# Patient Record
Sex: Male | Born: 1985 | Race: White | Hispanic: No | Marital: Single | Smoking: Former smoker
Health system: Southern US, Community
[De-identification: ages and names within clinical notes are randomized; demographics above are authoritative.]

## PROBLEM LIST (undated history)

## (undated) DIAGNOSIS — F17200 Nicotine dependence, unspecified, uncomplicated: Secondary | ICD-10-CM

## (undated) HISTORY — DX: Nicotine dependence, unspecified, uncomplicated: F17.200

---

## 2007-07-22 ENCOUNTER — Ambulatory Visit: Payer: Self-pay | Admitting: Family Medicine

## 2010-03-05 ENCOUNTER — Ambulatory Visit: Payer: Self-pay | Admitting: Family Medicine

## 2010-04-30 ENCOUNTER — Ambulatory Visit: Payer: Self-pay | Admitting: Family Medicine

## 2010-08-15 ENCOUNTER — Ambulatory Visit: Payer: Self-pay | Admitting: Physician Assistant

## 2010-08-15 ENCOUNTER — Ambulatory Visit (INDEPENDENT_AMBULATORY_CARE_PROVIDER_SITE_OTHER): Payer: 59 | Admitting: Family Medicine

## 2010-08-15 DIAGNOSIS — R42 Dizziness and giddiness: Secondary | ICD-10-CM

## 2011-06-16 ENCOUNTER — Ambulatory Visit (INDEPENDENT_AMBULATORY_CARE_PROVIDER_SITE_OTHER): Payer: Self-pay | Admitting: Medical

## 2011-06-16 ENCOUNTER — Encounter: Payer: Self-pay | Admitting: Internal Medicine

## 2011-06-16 ENCOUNTER — Encounter: Payer: Self-pay | Admitting: Medical

## 2011-06-16 VITALS — BP 110/80 | HR 68 | Temp 97.8°F | Resp 16 | Wt 172.0 lb

## 2011-06-16 DIAGNOSIS — R071 Chest pain on breathing: Secondary | ICD-10-CM

## 2011-06-16 DIAGNOSIS — R0789 Other chest pain: Secondary | ICD-10-CM

## 2011-06-16 DIAGNOSIS — S39012A Strain of muscle, fascia and tendon of lower back, initial encounter: Secondary | ICD-10-CM | POA: Insufficient documentation

## 2011-06-16 DIAGNOSIS — IMO0002 Reserved for concepts with insufficient information to code with codable children: Secondary | ICD-10-CM

## 2011-06-16 MED ORDER — CYCLOBENZAPRINE HCL 10 MG PO TABS: ORAL_TABLET | ORAL | Status: DC

## 2011-06-16 MED ORDER — HYDROCODONE-ACETAMINOPHEN 5-500 MG PO TABS: 1.0000 | ORAL_TABLET | Freq: Four times a day (QID) | ORAL | Status: AC | PRN

## 2011-06-16 NOTE — Patient Instructions (Signed)
Rest, no weight lifting until this pain resolves.  Remember to take deep breaths on the hour.  You can use an ice pack as desired the next few days.  Then, after 3 days, you can use heat if desired.    Use 3-4 OTC Ibuprofen every 6 hours for pain and inflammation.  I wrote 2 prescriptions today.  1 is a muscle relaxer you can use at bedtime or up to 3 times daily if needed.   I also wrote for Lortab which is a pain medication.  Use this sparingly only for breakthrough.    Low Back Strain with Rehab A strain is an injury in which a tendon or muscle is torn. The muscles and tendons of the lower back are vulnerable to strains. However, these muscles and tendons are very strong and require a great force to be injured. Strains are classified into three categories. Grade 1 strains cause pain, but the tendon is not lengthened. Grade 2 strains include a lengthened ligament, due to the ligament being stretched or partially ruptured. With grade 2 strains there is still function, although the function may be decreased. Grade 3 strains involve a complete tear of the tendon or muscle, and function is usually impaired. SYMPTOMS   Pain in the lower back.   Pain that affects one side more than the other.   Pain that gets worse with movement and may be felt in the hip, buttocks, or back of the thigh.   Muscle spasms of the muscles in the back.   Swelling along the muscles of the back.   Loss of strength of the back muscles.   Crackling sound (crepitation) when the muscles are touched.  CAUSES  Lower back strains occur when a force is placed on the muscles or tendons that is greater than they can handle. Common causes of injury include:  Prolonged overuse of the muscle-tendon units in the lower back, usually from incorrect posture.   A single violent injury or force applied to the back.  RISK INCREASES WITH:  Sports that involve twisting forces on the spine or a lot of bending at the waist (football,  rugby, weightlifting, bowling, golf, tennis, speed skating, racquetball, swimming, running, gymnastics, diving).   Poor strength and flexibility.   Failure to warm up properly before activity.   Family history of lower back pain or disk disorders.   Previous back injury or surgery (especially fusion).   Poor posture with lifting, especially heavy objects.   Prolonged sitting, especially with poor posture.  PREVENTION   Learn and use proper posture when sitting or lifting (maintain proper posture when sitting, lift using the knees and legs, not at the waist).   Warm up and stretch properly before activity.   Allow for adequate recovery between workouts.   Maintain physical fitness:   Strength, flexibility, and endurance.   Cardiovascular fitness.  PROGNOSIS  If treated properly, lower back strains usually heal within 6 weeks. RELATED COMPLICATIONS   Recurring symptoms, resulting in a chronic problem.   Chronic inflammation, scarring, and partial muscle-tendon tear.   Delayed healing or resolution of symptoms.   Prolonged disability.  TREATMENT  Treatment first involves the use of ice and medicine, to reduce pain and inflammation. The use of strengthening and stretching exercises may help reduce pain with activity. These exercises may be performed at home or with a therapist. Severe injuries may require referral to a therapist for further evaluation and treatment, such as ultrasound. Your caregiver may advise  that you wear a back brace or corset, to help reduce pain and discomfort. Often, prolonged bed rest results in greater harm then benefit. Corticosteroid injections may be recommended. However, these should be reserved for the most serious cases. It is important to avoid using your back when lifting objects. At night, sleep on your back on a firm mattress with a pillow placed under your knees. If non-surgical treatment is unsuccessful, surgery may be needed.  MEDICATION    If pain medicine is needed, nonsteroidal anti-inflammatory medicines (aspirin and ibuprofen), or other minor pain relievers (acetaminophen), are often advised.   Do not take pain medicine for 7 days before surgery.   Prescription pain relievers may be given, if your caregiver thinks they are needed. Use only as directed and only as much as you need.   Ointments applied to the skin may be helpful.   Corticosteroid injections may be given by your caregiver. These injections should be reserved for the most serious cases, because they may only be given a certain number of times.  HEAT AND COLD  Cold treatment (icing) should be applied for 10 to 15 minutes every 2 to 3 hours for inflammation and pain, and immediately after activity that aggravates your symptoms. Use ice packs or an ice massage.   Heat treatment may be used before performing stretching and strengthening activities prescribed by your caregiver, physical therapist, or athletic trainer. Use a heat pack or a warm water soak.  SEEK MEDICAL CARE IF:   Symptoms get worse or do not improve in 2 to 4 weeks, despite treatment.   You develop numbness, weakness, or loss of bowel or bladder function.  New, unexplained symptoms develop. (Drugs used in treatment may produce side effects.)

## 2011-06-16 NOTE — Progress Notes (Signed)
Subjective:   HPI  Jeremiah Bentley is a 25 y.o. male who presents with c/o injury and back pain.  He lifts weights regularly at the [facility].  Yesterday he was lifting 70 lb dumbbells to take to the bench to do flyes.  He went to move and became off balance lifting the weights slightly and overcompensated on the right.  Since then has had right mid back pain and side pain.  Hurts to take deep breaths as well.  He used 3 Ibuprofen last night with some relief.  Denies any recent fever, URI symptoms, no other injury or trauma.  No other aggravating or relieving factors.    No other c/o.  The following portions of the patient's history were reviewed and updated as appropriate: allergies, current medications, past family history, past medical history, past social history, past surgical history and problem list.  Past Medical History  Diagnosis Date  . Smoker    Review of Systems Constitutional: -fever, -chills, -sweats, -unexpected -weight change,-fatigue ENT: -runny nose, -ear pain, -sore throat Cardiology:  -chest pain, -palpitations, -edema Respiratory: -cough, -shortness of breath, -wheezing Gastroenterology: -abdominal pain, -nausea, -vomiting, -diarrhea, -constipation Hematology: -bleeding or bruising problems Musculoskeletal: -arthralgias, -myalgias, -joint swelling, +back pain Ophthalmology: -vision changes Urology: -dysuria, -difficulty urinating, -hematuria, -urinary frequency, -urgency Neurology: -headache, -weakness, -tingling, -numbness    Objective:   Physical Exam  Filed Vitals:   06/16/11 1013  BP: 110/80  Pulse: 68  Temp: 97.8 F (36.6 C)  Resp: 16    General appearance: alert, no distress, WD/WN, lean white male Neck: supple, no lymphadenopathy, no thyromegaly, no masses Heart: RRR, normal S1, S2, no murmurs Lungs: CTA bilaterally, no wheezes, rhonchi, or rales Abdomen: +bs, soft, non tender, non distended, no masses, no hepatomegaly, no splenomegaly Back: mild  right posterolateral back/chest wall around T8-T10 Chest wall: tender right middle posterolateral region generalized mildly Pulses: 2+ symmetric   Assessment and Plan :    Encounter Diagnoses  Name Primary?  . Back strain Yes  . Chest wall pain    Advise rest, no weight lifting until this resolves, ice pack if desired, and after 3 days can use a combination of ice and heat.  Use 3-4 ibuprofen over-the-counter every 6 hours for the next few days, Flexeril once to twice a day as needed, Lortab for breakthrough pain if needed, and as symptoms gradually resolve, can gradually resume normal activity.  Call return if worse or not improving in 5-7 days.  Follow-up otherwise when necessary

## 2012-09-13 ENCOUNTER — Encounter: Payer: Self-pay | Admitting: Family Medicine

## 2012-09-13 ENCOUNTER — Ambulatory Visit (INDEPENDENT_AMBULATORY_CARE_PROVIDER_SITE_OTHER): Payer: 59 | Admitting: Family Medicine

## 2012-09-13 VITALS — BP 130/90 | HR 60 | Wt 167.0 lb

## 2012-09-13 DIAGNOSIS — J019 Acute sinusitis, unspecified: Secondary | ICD-10-CM

## 2012-09-13 DIAGNOSIS — J209 Acute bronchitis, unspecified: Secondary | ICD-10-CM

## 2012-09-13 MED ORDER — AMOXICILLIN 875 MG PO TABS: 875.0000 mg | ORAL_TABLET | Freq: Two times a day (BID) | ORAL | Status: DC

## 2012-09-13 NOTE — Progress Notes (Signed)
  Subjective:    Patient ID: Jeremiah Bentley, male    DOB: 1985-08-08, 27 y.o.   MRN: [id number]  HPI He woke up 3 days ago with sinus headache, nasal congestion, rhinorrhea and productive cough with PND. No fever or chills but increased fatigue. Today he noted some blood in his nasal drainage. He continues had difficulty with coughing that causes chest pain. He quit smoking in October but has been using an e-cigarette.   Review of Systems     Objective:   Physical Exam alert and in no distress. Tympanic membranes and canals are normal. Throat is clear. Tonsils are normal. Neck is supple without adenopathy or thyromegaly. Cardiac exam shows a regular sinus rhythm without murmurs or gallops. Lungs are clear to auscultation. Nasal mucosa is red but no evidence of recent bleeding. Sinuses are nontender       Assessment & Plan:  Acute sinusitis - Plan: amoxicillin (AMOXIL) 875 MG tablet  Acute bronchitis - Plan: amoxicillin (AMOXIL) 875 MG tablet Use Robitussin-DM during the day and you can use NyQuil at night. Take all the antibiotic and if not totally back to normal when you finish them, call me.

## 2012-09-13 NOTE — Patient Instructions (Addendum)
Use Robitussin-DM during the day and you can use NyQuil at night. Take all the antibiotic and if not totally back to normal when you finish them, call me.

## 2012-11-17 ENCOUNTER — Encounter: Payer: Self-pay | Admitting: Medical

## 2012-11-17 ENCOUNTER — Ambulatory Visit (INDEPENDENT_AMBULATORY_CARE_PROVIDER_SITE_OTHER): Payer: PRIVATE HEALTH INSURANCE | Admitting: Medical

## 2012-11-17 VITALS — BP 118/80 | HR 88 | Temp 98.0°F | Resp 16 | Wt 173.0 lb

## 2012-11-17 DIAGNOSIS — M25519 Pain in unspecified shoulder: Secondary | ICD-10-CM

## 2012-11-17 DIAGNOSIS — S239XXA Sprain of unspecified parts of thorax, initial encounter: Secondary | ICD-10-CM

## 2012-11-17 DIAGNOSIS — S29012A Strain of muscle and tendon of back wall of thorax, initial encounter: Secondary | ICD-10-CM

## 2012-11-17 DIAGNOSIS — M25511 Pain in right shoulder: Secondary | ICD-10-CM

## 2012-11-17 MED ORDER — NAPROXEN SODIUM ER 500 MG PO TB24: 500.0000 mg | ORAL_TABLET | Freq: Every day | ORAL | Status: DC

## 2012-11-17 NOTE — Patient Instructions (Addendum)
Left shoulder pain seems to be muscle strain of the latissimus muscle  Recommendations:  For the next 3-5 days, use relative rest  Consider heat pad to the back  Begin samples of Naprelan 500mg , 1 tablet daily for the next 5-6 days  Over the next 3-5 days, no heavy lifting, no strenuous activity with the left arm   Right shoulder pain, hx/o cervical plexus injury  For pain flares ups, treat this similar to what we discussed with the left shoulder  Rest for a few days at time   OTC aleve up to twice daily as needed   Heat pad or ice for flare ups  Avoid heavy lifting with flare ups  Call back with name of the cream your mother gave you

## 2012-11-17 NOTE — Progress Notes (Signed)
Subjective:  Jeremiah Bentley is a 27 y.o. male who presents with left upper back pain, can shoot to the front of the chest.  He is right handed.  He notes shoulder pains in left posterior area.  Started this past Friday as a dull pain, but as weekend progressed, had sharp pain coming to the front of chest.  Used mom's prescription cream topically and this helped.  Works as Control and instrumentation engineer.  He does lift auto parts, lifted a car door last week, but no specific injury or trauma.  No redness, no bruising, no fever, no chest pain, no SOB or dyspnea, no URI symptoms.  No NV, no hemoptysis.  Has had similar on right side pain from football stinger injuries in the past.   Does get these right sided pains periodically.   Occasionally has right neck pain.  No numbness, tingling, weakness.  Riding in car for long periods makes right shoulder girdle sore, but lifting things the wrong way can aggravate the right arm.  Bending down, working on cars can aggravate the left shoulder pain.  Using nothing else including ice for symptoms.   No prior back or neck injuries.  No other aggravating or relieving factors.    No other c/o.  The following portions of the patient's history were reviewed and updated as appropriate: allergies, current medications, past family history, past medical history, past social history, past surgical history and problem list.  Past Medical History  Diagnosis Date  . Smoker    History reviewed. No pertinent past surgical history.  ROS Otherwise as in subjective above  Objective: Physical Exam  Vital signs reviewed  General appearance: alert, no distress, WD/WN, muscular white male Neck: supple, no lymphadenopathy, no thyromegaly, no masses, normal ROM, nontender MSK: no obvious step off, asymmetry or deformity. Left latissimus and posterior shoulder tender, otherwise left shoulder nontender, no deformity, no step off, normal ROM, otherwise unremarkable exam.   Right shoulder nontender, no deformity, normal exam.   Back: nontender UE neurovascularly intact  Assessment: Encounter Diagnoses  Name Primary?  . Strain of latissimus dorsi muscle, initial encounter Yes  . Shoulder pain, right     Plan: Left latissimus strain.  Right shoulder pain, etiology unclear, but no major findings today.  If problem worsens in the right, consider neck and shoulder xrays.  Patient Instructions  Left shoulder pain seems to be muscle strain of the latissimus muscle  Recommendations:  For the next 3-5 days, use relative rest  Consider heat pad to the back  Begin samples of Naprelan 500mg , 1 tablet daily for the next 5-6 days  Over the next 3-5 days, no heavy lifting, no strenuous activity with the left arm   Right shoulder pain, hx/o cervical plexus injury  For pain flares ups, treat this similar to what we discussed with the left shoulder  Rest for a few days at time   OTC aleve up to twice daily as needed   Heat pad or ice for flare ups  Avoid heavy lifting with flare ups  Call back with name of the cream your mother gave you

## 2013-12-06 ENCOUNTER — Ambulatory Visit (INDEPENDENT_AMBULATORY_CARE_PROVIDER_SITE_OTHER): Payer: PRIVATE HEALTH INSURANCE | Admitting: Medical

## 2013-12-06 ENCOUNTER — Encounter: Payer: Self-pay | Admitting: Medical

## 2013-12-06 VITALS — BP 102/78 | HR 68 | Temp 100.7°F | Resp 16 | Wt 177.0 lb

## 2013-12-06 DIAGNOSIS — J020 Streptococcal pharyngitis: Secondary | ICD-10-CM

## 2013-12-06 DIAGNOSIS — J029 Acute pharyngitis, unspecified: Secondary | ICD-10-CM

## 2013-12-06 LAB — POCT RAPID STREP A (OFFICE): Rapid Strep A Screen: POSITIVE — AB

## 2013-12-06 MED ORDER — PENICILLIN V POTASSIUM 500 MG PO TABS: ORAL_TABLET | ORAL | Status: DC

## 2013-12-06 NOTE — Progress Notes (Signed)
Subjective:  Jeremiah Bentley is a 28 y.o. male who presents for sore throat.  He reports a one-day history of sore throat, aggressively worsen, started on the left, now hurts bilat, has felt some dizzy, chills, has had hacking cough, difficulty swallowing.  Denies fever, headache, NVD, congestion, ear pain.  Using nothing for symptoms.  Denies sick contacts.  No other aggravating or relieving factors.  No other c/o.  ROS as in subjective   Objective: Filed Vitals:   12/06/13 1429  BP: 102/78  Pulse: 68  Temp: 100.7 F (38.2 C)  Resp: 16    General appearance: Alert, WD/WN, no distress                             Skin: warm, no rash                           Head: no sinus tenderness,                            Eyes: conjunctiva normal, corneas clear, PERRLA                            Ears: pearly TMs, external ear canals normal                          Nose: septum midline, turbinates normal, no discharge             Mouth/throat: MMM, tongue normal, mild pharyngeal erythema, + left white mild tonsillar exudate                           Neck: supple, shoddy anterior nodes, no thyromegaly, nontender                          Heart: RRR, normal S1, S2, no murmurs                         Lungs: CTA bilaterally, no wheezes, rales, or rhonchi      Assessment and Plan:  Encounter Diagnoses  Name Primary?  . Strep pharyngitis Yes  . Sore throat    Advised that sore throat etiology appears to be bacterial.  Antibiotics per orders below.  Discussed symptoms, diagnosis, and possible complications including peritonsillar abscess formation.  Advised that they will be infectious for 24 hours after starting antibiotics.  Discussed means of prevention, precautions.  Supportive care recommended including OTC analgesics, salt water gargles, warm fluids, good hydration, and rest.  Discussed signs or symptoms that would prompt immediate evaluation.   Call or return if worse or not improving in the next  2-3 days.

## 2013-12-06 NOTE — Patient Instructions (Signed)
Strep Throat Strep throat is an infection of the throat. It is caused by a germ. Strep throat spreads from person to person by coughing, sneezing, or close contact. HOME CARE  Rinse your mouth (gargle) with warm salt water (1 teaspoon salt in 1 cup of water). Do this 3 to 4 times per day or as needed for comfort.   Family members with a sore throat or fever should see a doctor.   Make sure everyone in your house washes their hands well.   Do not share food, drinking cups, or personal items.   Eat soft foods until your sore throat gets better.   Drink enough water and fluids to keep your pee (urine) clear or pale yellow.   Rest.   Stay home from school, daycare, or work until you have taken medicine for 24 hours.   Only take medicine as told by your doctor.   Take your medicine as told. Finish it even if you start to feel better.  GET HELP RIGHT AWAY IF:   You have new problems, such as throwing up (vomiting) or bad headaches.   You have a stiff or painful neck, chest pain, trouble breathing, or trouble swallowing.   You have very bad throat pain, drooling, or changes in your voice.   Your neck puffs up (swells) or gets red and tender.   You have a fever.   You are very tired, your mouth is dry, or you are peeing less than normal.   You cannot wake up completely.   You get a rash, cough, or earache.   You have green, yellow-brown, or bloody spit.   Your pain does not get better with medicine.  MAKE SURE YOU:   Understand these instructions.   Will watch your condition.   Will get help right away if you are not doing well or get worse.  Document Released: 12/10/2007 Document Revised: 03/05/2011 Document Reviewed: 08/22/2010 [employer] Patient Information 2012 [employer]. 

## 2014-01-09 ENCOUNTER — Ambulatory Visit (INDEPENDENT_AMBULATORY_CARE_PROVIDER_SITE_OTHER): Payer: PRIVATE HEALTH INSURANCE | Admitting: Family Medicine

## 2014-01-09 ENCOUNTER — Encounter: Payer: Self-pay | Admitting: Family Medicine

## 2014-01-09 VITALS — BP 130/84 | HR 80 | Ht 68.0 in | Wt 183.0 lb

## 2014-01-09 DIAGNOSIS — M546 Pain in thoracic spine: Secondary | ICD-10-CM

## 2014-01-09 MED ORDER — HYDROCODONE-ACETAMINOPHEN 5-325 MG PO TABS: 1.0000 | ORAL_TABLET | Freq: Four times a day (QID) | ORAL | Status: DC | PRN

## 2014-01-09 MED ORDER — NAPROXEN 500 MG PO TABS: 500.0000 mg | ORAL_TABLET | Freq: Two times a day (BID) | ORAL | Status: DC

## 2014-01-09 NOTE — Progress Notes (Signed)
Chief Complaint  Patient presents with  . Back Pain    pain in his mid back that radiates around to the front (rib area). Says he may have pulled something yesterday when he went to "pop his back."   After getting off his lawnmower his back was bothering him.  He went to "pop" his back, and then developed severe pain.  Hurts to breath, pain radiates around both sides of his back.   Denies radiation of pain down the legs, or around to abdomen, remains in the back, radiating laterally only.   Something similar occurred in the past, in 2012, treated with muscle relaxant, pain pills and resolved.  Took 2 extra strength tylenol last night, and 2 this morning.  No other medications.  Past Medical History  Diagnosis Date  . Smoker    History reviewed. No pertinent past surgical history. History   Social History  . Marital Status: Single    Spouse Name: N/A    Number of Children: N/A  . Years of Education: N/A   Occupational History  . Not on file.   Social History Main Topics  . Smoking status: Current Every Day Smoker    Types: Cigarettes  . Smokeless tobacco: Not on file     Comment: smoked 1PPD, quit, restarted; now smoking 1 pack/week.  using e-cigs  . Alcohol Use: Yes     Comment: 8 drinks per week.  . Drug Use: No  . Sexual Activity: Not on file   Other Topics Concern  . Not on file   Social History Narrative  . No narrative on file   No current outpatient prescriptions on file prior to visit.   No current facility-administered medications on file prior to visit.   No Known Allergies  ROS:  Denies fevers, chills, URI symptoms, urinary complaints, numbness, tingling, weakness or other complaints.  No nausea, vomiting, diarrhea, bleeding/bruising, rash  PHYSICAL EXAM: BP 130/84  Pulse 80  Ht 5\' 8"  (1.727 m)  Wt 183 lb (83.008 kg)  BMI 27.83 kg/m2  Pleasant male, in moderate distress with movement.  Breathing comfortably, speaking easily Back Spine is nontender  to palpation.  No CVA tenderness. No muscle spasm palpable. Area of discomfort is upper lumbar, lower thoracic paraspinous area.   Lungs clear bilaterally Heart: regular rate and rhythm Skin: no rashes, bruising  ASSESSMENT/PLAN:  Midline thoracic back pain - Plan: naproxen (NAPROSYN) 500 MG tablet, HYDROcodone-acetaminophen (NORCO/VICODIN) 5-325 MG per tablet  Ice alternating with heat.  Stretches Declined toradol injection. Risks/side effects of prescribed meds were reviewed in detail.  Use naproxen twice daily with food. Take the hydrocodone only if needed for severe pain.  This also contains acetaminophen, so do NOT take the hydrocodone along with the pain medication that you already have. Do not use any other over-the-counter pain medications.  Consider seeing chiropractor if pain persists despite these measures.

## 2014-01-09 NOTE — Patient Instructions (Signed)
Use naproxen twice daily with food. Take the hydrocodone only if needed for severe pain.  This also contains acetaminophen, so do NOT take the hydrocodone along with the pain medication that you already have. Do not use any other over-the-counter pain medications.  Consider seeing chiropractor if pain persists despite these measures.

## 2015-02-12 ENCOUNTER — Ambulatory Visit (INDEPENDENT_AMBULATORY_CARE_PROVIDER_SITE_OTHER): Payer: Managed Care, Other (non HMO) | Admitting: Family Medicine

## 2015-02-12 ENCOUNTER — Encounter: Payer: Self-pay | Admitting: Family Medicine

## 2015-02-12 VITALS — BP 138/88 | HR 80 | Temp 98.1°F | Wt 181.8 lb

## 2015-02-12 DIAGNOSIS — R05 Cough: Secondary | ICD-10-CM | POA: Diagnosis not present

## 2015-02-12 DIAGNOSIS — R059 Cough, unspecified: Secondary | ICD-10-CM

## 2015-02-12 DIAGNOSIS — J029 Acute pharyngitis, unspecified: Secondary | ICD-10-CM | POA: Diagnosis not present

## 2015-02-12 LAB — POCT RAPID STREP A (OFFICE): Rapid Strep A Screen: NEGATIVE

## 2015-02-12 NOTE — Progress Notes (Addendum)
   Subjective:    Patient ID: Jeremiah Bentley, male    DOB: 1985-10-29, 29 y.o.   MRN: [id number]  HPI Patient presents for a 7 day history of dry cough with occasional clear mucous production. Cough is more bothersome during the day and is not keeping him awake at night. He also complains of a 1 day history of sore throat with some mild post nasal drainage. He states he would like to make sure he does not have "strep". He denies fever, chills, sinus pressure, congestion, ear pain. He states his cough and sore throat have actually improved since yesterday. He is taking Nyquil and cough drops for relief. No sick contacts. He smokes cigarettes.  He states he thinks he may have allergies to dust and works in an [employer]. He is not currently taking medication for this.    Review of Systems Review of Systems Constitutional: -fever, -chills, -sweats, -unexpected weight change,-fatigue ENT: -runny nose, -ear pain, +sore throat Cardiology:  -chest pain, -palpitations, -edema Respiratory: +cough, -shortness of breath, -wheezing        Objective:   Physical Exam Alert and in no distress.  Tympanic membranes and canals are normal. Sinuses without tenderness, nares pink. Pharyngeal area is normal. Neck is supple without adenopathy. Cardiac exam shows a regular sinus rhythm without murmurs or gallops. Lungs are clear to auscultation.  Rapid strep test negative      Assessment & Plan:  Cough  Sore throat - Plan: Rapid Strep A  Recommended symptom management since he is symptoms seem to be improving. Encouraged hydration. Suspect that he his symptoms are viral or that environmental allergies could be playing a role with his current illness. Advised that if he is not improving in the next few days or if his symptoms get much worse that he should let me know. May try taking Claritin to see if this helps.

## 2015-05-02 ENCOUNTER — Telehealth: Payer: Self-pay | Admitting: Family Medicine

## 2015-05-02 ENCOUNTER — Encounter: Payer: Self-pay | Admitting: Family Medicine

## 2015-05-02 ENCOUNTER — Ambulatory Visit (INDEPENDENT_AMBULATORY_CARE_PROVIDER_SITE_OTHER): Payer: Managed Care, Other (non HMO) | Admitting: Family Medicine

## 2015-05-02 VITALS — BP 128/84 | HR 72 | Ht 68.0 in | Wt 182.2 lb

## 2015-05-02 DIAGNOSIS — Z0000 Encounter for general adult medical examination without abnormal findings: Secondary | ICD-10-CM

## 2015-05-02 DIAGNOSIS — F17200 Nicotine dependence, unspecified, uncomplicated: Secondary | ICD-10-CM

## 2015-05-02 LAB — CBC WITH DIFFERENTIAL/PLATELET
BASOS ABS: 0.1 10*3/uL (ref 0.0–0.1)
Basophils Relative: 1 % (ref 0–1)
EOS ABS: 0.4 10*3/uL (ref 0.0–0.7)
EOS PCT: 3 % (ref 0–5)
HEMATOCRIT: 48.4 % (ref 39.0–52.0)
Hemoglobin: 16.8 g/dL (ref 13.0–17.0)
LYMPHS ABS: 3.8 10*3/uL (ref 0.7–4.0)
LYMPHS PCT: 32 % (ref 12–46)
MCH: 30.4 pg (ref 26.0–34.0)
MCHC: 34.7 g/dL (ref 30.0–36.0)
MCV: 87.5 fL (ref 78.0–100.0)
MONO ABS: 0.6 10*3/uL (ref 0.1–1.0)
MONOS PCT: 5 % (ref 3–12)
MPV: 9.7 fL (ref 8.6–12.4)
Neutro Abs: 7.1 10*3/uL (ref 1.7–7.7)
Neutrophils Relative %: 59 % (ref 43–77)
PLATELETS: 308 10*3/uL (ref 150–400)
RBC: 5.53 MIL/uL (ref 4.22–5.81)
RDW: 11.8 % (ref 11.5–15.5)
WBC: 12 10*3/uL — ABNORMAL HIGH (ref 4.0–10.5)

## 2015-05-02 LAB — LIPID PANEL
Cholesterol: 200 mg/dL (ref 125–200)
HDL: 31 mg/dL — ABNORMAL LOW (ref >=40)
LDL CALC: 145 mg/dL — AB (ref <130)
TRIGLYCERIDES: 118 mg/dL (ref <150)
Total CHOL/HDL Ratio: 6.5 Ratio — ABNORMAL HIGH (ref <=5.0)
VLDL: 24 mg/dL (ref <30)

## 2015-05-02 LAB — COMPREHENSIVE METABOLIC PANEL
ALBUMIN: 4.7 g/dL (ref 3.6–5.1)
ALT: 18 U/L (ref 9–46)
AST: 19 U/L (ref 10–40)
Alkaline Phosphatase: 50 U/L (ref 40–115)
BILIRUBIN TOTAL: 0.6 mg/dL (ref 0.2–1.2)
BUN: 9 mg/dL (ref 7–25)
CO2: 26 mmol/L (ref 20–31)
CREATININE: 0.9 mg/dL (ref 0.60–1.35)
Calcium: 9.8 mg/dL (ref 8.6–10.3)
Chloride: 104 mmol/L (ref 98–110)
Glucose, Bld: 93 mg/dL (ref 65–99)
Potassium: 3.9 mmol/L (ref 3.5–5.3)
Sodium: 139 mmol/L (ref 135–146)
Total Protein: 7 g/dL (ref 6.1–8.1)

## 2015-05-02 MED ORDER — VARENICLINE TARTRATE 0.5 MG X 11 & 1 MG X 42 PO MISC: ORAL | Status: DC

## 2015-05-02 NOTE — Telephone Encounter (Signed)
Called in [place], left message for pt

## 2015-05-02 NOTE — Patient Instructions (Signed)
Call [phone] quit now. Look at when, where and why you smoke and then make a list of things that you will do instead of smoking

## 2015-05-02 NOTE — Progress Notes (Signed)
Subjective:    Patient ID: Jeremiah Bentley, male    DOB: 02/22/1986, 29 y.o.   MRN: [id number]  HPI He is here for complete examination. He is interested in quitting smoking. He smokes one pack per day and has done this for several years. In the past he has tried cold [place] as well as vapor but has found them to not be for useful. He is noting more difficulty with shortness of breath and coughing. He is now dating someone and she is also interested in him quitting. They're apparently trying to have children. He has no other concerns or complaints. He's had no nasal congestion, allergies, abdominal symptoms. Family and social history as well as health maintenance and immunizations were reviewed.   Review of Systems  All other systems reviewed and are negative.      Objective:   Physical Exam BP 128/84 mmHg  Pulse 72  Ht 5\' 8"  (1.727 m)  Wt 182 lb 3.2 oz (82.645 kg)  BMI 27.71 kg/m2  General Appearance:    Alert, cooperative, no distress, appears stated age  Head:    Normocephalic, without obvious abnormality, atraumatic  Eyes:    PERRL, conjunctiva/corneas clear, EOM's intact, fundi    benign  Ears:    Normal TM's and external ear canals  Nose:   Nares normal, mucosa normal, no drainage or sinus   tenderness  Throat:   Lips, mucosa, and tongue normal; teeth and gums normal  Neck:   Supple, no lymphadenopathy;  thyroid:  no   enlargement/tenderness/nodules; no carotid   bruit or JVD  Back:    Spine nontender, no curvature, ROM normal, no CVA     tenderness  Lungs:     Clear to auscultation bilaterally without wheezes, rales or     ronchi; respirations unlabored  Chest Wall:    No tenderness or deformity   Heart:    Regular rate and rhythm, S1 and S2 normal, no murmur, rub   or gallop  Breast Exam:    No chest wall tenderness, masses or gynecomastia  Abdomen:     Soft, non-tender, nondistended, normoactive bowel sounds,    no masses, no hepatosplenomegaly  Genitalia:    Normal  male external genitalia without lesions.  Testicles without masses.  No inguinal hernias mole present at the base of the penis.  Rectal:   Deferred due to age <40 and lack of symptoms  Extremities:   No clubbing, cyanosis or edema  Pulses:   2+ and symmetric all extremities  Skin:   Skin color, texture, turgor normal, no rashes or lesions  Lymph nodes:   Cervical, supraclavicular, and axillary nodes normal  Neurologic:   CNII-XII intact, normal strength, sensation and gait; reflexes 2+ and symmetric throughout          Psych:   Normal mood, affect, hygiene and grooming.          Assessment & Plan:  Smoker - Plan: varenicline (CHANTIX STARTING MONTH PAK) 0.5 MG X 11 & 1 MG X 42 tablet  Routine general medical examination at a health care facility - Plan: CBC with Differential/Platelet, Comprehensive metabolic panel, Lipid panel  the mole at the base of the penis is not a concern and I expressed this to him. I then discussed smoking cessation in regard to habit versus addiction. Discussed the need for him to look at his smoking habits in regard to when where and why and then set up a game plan on what  he will do instead of smoking for least timeframes. Chantix prescription written. He is to set up an appointment in one month to see me. I left it up to him as to whether his sepsis appointment up explaining that that will tell me when he is truly ready to quit smoking. Smoking information was given to him. At least 10 minutes was spent discussing this with him.

## 2015-09-04 ENCOUNTER — Ambulatory Visit (INDEPENDENT_AMBULATORY_CARE_PROVIDER_SITE_OTHER): Payer: Managed Care, Other (non HMO) | Admitting: Family Medicine

## 2015-09-04 ENCOUNTER — Encounter: Payer: Self-pay | Admitting: Family Medicine

## 2015-09-04 VITALS — BP 122/78 | HR 102 | Temp 98.1°F | Wt 171.5 lb

## 2015-09-04 DIAGNOSIS — J0110 Acute frontal sinusitis, unspecified: Secondary | ICD-10-CM

## 2015-09-04 DIAGNOSIS — Z720 Tobacco use: Secondary | ICD-10-CM | POA: Diagnosis not present

## 2015-09-04 DIAGNOSIS — F17200 Nicotine dependence, unspecified, uncomplicated: Secondary | ICD-10-CM

## 2015-09-04 MED ORDER — AMOXICILLIN 875 MG PO TABS: 875.0000 mg | ORAL_TABLET | Freq: Two times a day (BID) | ORAL | Status: DC

## 2015-09-04 NOTE — Progress Notes (Signed)
   Subjective:    Patient ID: Jeremiah Bentley, male    DOB: 13-Jun-1986, 30 y.o.   MRN: [id number]  HPI He complains of a one-week istory is started with nasal congestion, rhinorrhea, slight cough and hoarse voice but no sore throat, earache. Yesterday he noted increased difficulty with weakness as well as rhinorrhea, sinus pressure and purulent postnasal drainage. He does smoke. He did try Chantix but had unacceptable side effects from it. He also has underlying springtime allergies but rarely uses medication for this.   Review of Systems     Objective:   Physical Exam Alert and in no distress. Tympanic membranes and canals are normal. Nasal mucosa is normal however he is tender over frontal and maxillary sinusesPharyngeal area is normal. Neck is supple without adenopathy or thyromegaly. Cardiac exam shows a regular sinus rhythm without murmurs or gallops. Lungs are clear to auscultation.        Assessment & Plan:  Acute frontal sinusitis, recurrence not specified - Plan: amoxicillin (AMOXIL) 875 MG tablet  Current smoker Christian to call me if not entirely better when he finishes the antibiotic also discussed smoking cessation and at this time he is not ready to quit.

## 2015-09-04 NOTE — Patient Instructions (Signed)
Take all the antibiotic and if not totally back to normal when he finished E no

## 2016-03-03 ENCOUNTER — Telehealth: Payer: Self-pay | Admitting: Family Medicine

## 2016-03-04 ENCOUNTER — Encounter: Payer: Self-pay | Admitting: Family Medicine

## 2016-03-04 ENCOUNTER — Ambulatory Visit (INDEPENDENT_AMBULATORY_CARE_PROVIDER_SITE_OTHER): Payer: Managed Care, Other (non HMO) | Admitting: Family Medicine

## 2016-03-04 VITALS — BP 120/80 | HR 65 | Wt 186.8 lb

## 2016-03-04 DIAGNOSIS — M7551 Bursitis of right shoulder: Secondary | ICD-10-CM

## 2016-03-04 DIAGNOSIS — M25511 Pain in right shoulder: Secondary | ICD-10-CM

## 2016-03-04 MED ORDER — LIDOCAINE HCL 2 % IJ SOLN
3.0000 mL | Freq: Once | INTRAMUSCULAR | Status: AC
  Administered 2016-03-04: 60 mg via INTRADERMAL

## 2016-03-04 MED ORDER — TRIAMCINOLONE ACETONIDE 40 MG/ML IJ SUSP
40.0000 mg | Freq: Once | INTRAMUSCULAR | Status: AC
  Administered 2016-03-04: 40 mg via INTRAMUSCULAR

## 2016-03-04 NOTE — Telephone Encounter (Signed)
error 

## 2016-03-04 NOTE — Progress Notes (Signed)
   Subjective:    Patient ID: Jeremiah Bentley, male    DOB: 11/14/1985, 30 y.o.   MRN: [id number]  HPI He states that in March of this year while doing bench presses he noted the day after he increased his weight that he had right shoulder discomfort. Rested initially but then tried to lift weights and again had difficulty. He does feel a popping sensation in that right shoulder that is not improved with rest. Has been no locking or feeling of instability. He does complain of a slight aching sensation.   Review of Systems     Objective:   Physical Exam Full range of motion of the shoulder. No tenderness over the bicipital groove or tendon. Negative sulcus sign. Rotator cuff testing could cause some discomfort but no overt weakness is noted. Near's and Hawkins test both cause a loud clicking sensation that was easily heard.       Assessment & Plan:  Right shoulder pain - Plan: lidocaine (XYLOCAINE) 2 % (with pres) injection 60 mg, triamcinolone acetonide (KENALOG-40) injection 40 mg  Acute bursitis of right shoulder I think his symptoms are most consistent with a rotator cuff tendinitis. I discussed various options with him and decided to get an injection. 40 mg of Kenalog and 3 mL of Xylocaine was injected into the subacromial bursa without  difficulty. He did say that the aching sensation to get better. Discussed follow-up with him in terms of how long it would take to get relief. If he continues have difficulty with pain/clicking sensation, I will refer for further evaluation. He is comfortable with this.

## 2016-05-19 ENCOUNTER — Ambulatory Visit (INDEPENDENT_AMBULATORY_CARE_PROVIDER_SITE_OTHER): Payer: Managed Care, Other (non HMO) | Admitting: Medical

## 2016-05-19 ENCOUNTER — Encounter: Payer: Self-pay | Admitting: Medical

## 2016-05-19 VITALS — BP 116/72 | HR 64 | Wt 193.2 lb

## 2016-05-19 DIAGNOSIS — S39012A Strain of muscle, fascia and tendon of lower back, initial encounter: Secondary | ICD-10-CM

## 2016-05-19 DIAGNOSIS — M62830 Muscle spasm of back: Secondary | ICD-10-CM

## 2016-05-19 MED ORDER — CYCLOBENZAPRINE HCL 10 MG PO TABS: ORAL_TABLET | ORAL | 0 refills | Status: DC

## 2016-05-19 MED ORDER — NAPROXEN 500 MG PO TABS: 500.0000 mg | ORAL_TABLET | Freq: Two times a day (BID) | ORAL | 0 refills | Status: DC

## 2016-05-19 NOTE — Progress Notes (Signed)
Subjective: Chief Complaint  Patient presents with  . back pain    x2  days  lower back pain , lifting a truck bed on to  trailer    Here for back pain.  Injured himself Saturday 05/17/16.    Pulled a muscle loading a truck bed onto a trailer.   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   Thinks he pulled a muscle.  Had some immediate pain setting the bed up on the trailer.  But then worked through the pain to move the trailer.   10-15 minutes later had pain with breathing, pain in back moving around.   Currently pain has improved slightly, but still has sharp pains a times, dull pain just sitting in the chair currently.  Pain wraps around to the front at times.   Mostly pain in right low back.   Denies pain down legs, no numbness, no tingling or weakness in UE or LE.   No fever.  No incontinence.   No saddle anesthesia.  Using Ibuprofen and tried some naproxen.   This helped some.   No other aggravating or relieving factors. No other complaint.  Past Medical History:  Diagnosis Date  . Smoker    No current outpatient prescriptions on file prior to visit.   No current facility-administered medications on file prior to visit.     No past surgical history on file.  ROS as in subjective   Objective: BP 116/72   Pulse 64   Wt 193 lb 3.2 oz (87.6 kg)   SpO2 98%   BMI 29.38 kg/m   Gen;wd, wn, nad Skin: unremarkable, no bruising, no erythema Tender right lumbar paraspinal region, +spasm, no scoliosis, no deformity Pain with back flexion and extension, but ROM about 80% of normal, limited by pain Arms and legs with normal strength, sensation, DTRs, normal heel and toe walk Pulse WNL No edema    Assessment: Encounter Diagnoses  Name Primary?  . Strain of muscle, fascia and tendon of lower back, initial encounter Yes  . Back muscle spasm     Plan: Discussed symptoms, exam, mechanism of injury, and discussed treatment  recommendations.  Recommendations:  Begin prescription Naprosyn twice daily for 7-10 days for pain and inflammation of back  Take this with food  You can use the flexeril muscle relaxer, 1/2- 1 tablet at bedtime for back spasm and pain  Don't take the flexeril during the day as this can cause drowsiness  You can use heat to the low back, you can consider getting a massage  Use stretching and gentle range of motion movements with the back throughout the day  Don't sit in a chair or stay in one place for prolonged periods as this may worsen the pain  Don't lift anything heavy over 20lb this week if possible  After the next 4-5 days, gradually resume normal activity as the pain improves.  Molly MaduroRobert was seen today for back pain.  Diagnoses and all orders for this visit:  Strain of muscle, fascia and tendon of lower back, initial encounter  Back muscle spasm  Other orders -     cyclobenzaprine (FLEXERIL) 10 MG tablet; 1/2-1 tablet po QHS back pain -     naproxen (NAPROSYN) 500 MG tablet; Take 1 tablet (500 mg total) by mouth 2 (two) times daily with a meal.

## 2016-05-19 NOTE — Patient Instructions (Signed)
Encounter Diagnoses  Name Primary?  . Strain of muscle, fascia and tendon of lower back, initial encounter Yes  . Back muscle spasm    Recommendations:  Begin prescription Naprosyn twice daily for 7-10 days for pain and inflammation of back  Take this with food  You can use the flexeril muscle relaxer, 1/2- 1 tablet at bedtime for back spasm and pain  Don't take the flexeril during the day as this can cause drowsiness  You can use heat to the low back, you can consider getting a massage  Use stretching and gentle range of motion movements with the back throughout the day  Don't sit in a chair or stay in one place for prolonged periods as this may worsen the pain  Don't lift anything heavy over 20lb this week if possible  After the next 4-5 days, gradually resume normal activity as the pain improves.    Lumbosacral Strain Lumbosacral strain is a strain of any of the parts that make up your lumbosacral vertebrae. Your lumbosacral vertebrae are the bones that make up the lower third of your backbone. Your lumbosacral vertebrae are held together by muscles and tough, fibrous tissue (ligaments).  CAUSES  A sudden blow to your back can cause lumbosacral strain. Also, anything that causes an excessive stretch of the muscles in the low back can cause this strain. This is typically seen when people exert themselves strenuously, fall, lift heavy objects, bend, or crouch repeatedly. RISK FACTORS  Physically demanding work.  Participation in pushing or pulling sports or sports that require a sudden twist of the back (tennis, golf, baseball).  Weight lifting.  Excessive lower back curvature.  Forward-tilted pelvis.  Weak back or abdominal muscles or both.  Tight hamstrings. SIGNS AND SYMPTOMS  Lumbosacral strain may cause pain in the area of your injury or pain that moves (radiates) down your leg.  DIAGNOSIS Your health care provider can often diagnose lumbosacral strain through a  physical exam. In some cases, you may need tests such as X-ray exams.  TREATMENT  Treatment for your lower back injury depends on many factors that your clinician will have to evaluate. However, most treatment will include the use of anti-inflammatory medicines. HOME CARE INSTRUCTIONS   Avoid hard physical activities (tennis, racquetball, waterskiing) if you are not in proper physical condition for it. This may aggravate or create problems.  If you have a back problem, avoid sports requiring sudden body movements. Swimming and walking are generally safer activities.  Maintain good posture.  Maintain a healthy weight.  For acute conditions, you may put ice on the injured area.  Put ice in a plastic bag.  Place a towel between your skin and the bag.  Leave the ice on for 20 minutes, 2-3 times a day.  When the low back starts healing, stretching and strengthening exercises may be recommended. SEEK MEDICAL CARE IF:  Your back pain is getting worse.  You experience severe back pain not relieved with medicines. SEEK IMMEDIATE MEDICAL CARE IF:   You have numbness, tingling, weakness, or problems with the use of your arms or legs.  There is a change in bowel or bladder control.  You have increasing pain in any area of the body, including your belly (abdomen).  You notice shortness of breath, dizziness, or feel faint.  You feel sick to your stomach (nauseous), are throwing up (vomiting), or become sweaty.  You notice discoloration of your toes or legs, or your feet get very cold. MAKE  SURE YOU:   Understand these instructions.  Will watch your condition.  Will get help right away if you are not doing well or get worse.   This information is not intended to replace advice given to you by your health care provider. Make sure you discuss any questions you have with your health care provider.   Document Released: 04/02/2005 Document Revised: 07/14/2014 Document Reviewed:  02/09/2013 Elsevier Interactive Patient Education [employer]2016 [employer] Inc.

## 2016-08-11 ENCOUNTER — Ambulatory Visit (INDEPENDENT_AMBULATORY_CARE_PROVIDER_SITE_OTHER): Payer: Managed Care, Other (non HMO) | Admitting: Family Medicine

## 2016-08-11 ENCOUNTER — Encounter: Payer: Self-pay | Admitting: Family Medicine

## 2016-08-11 VITALS — BP 120/80 | HR 88 | Temp 98.5°F | Resp 16 | Wt 202.8 lb

## 2016-08-11 DIAGNOSIS — J111 Influenza due to unidentified influenza virus with other respiratory manifestations: Secondary | ICD-10-CM | POA: Diagnosis not present

## 2016-08-11 DIAGNOSIS — M25511 Pain in right shoulder: Secondary | ICD-10-CM | POA: Diagnosis not present

## 2016-08-11 NOTE — Patient Instructions (Signed)
800 mg of ibuprofen 3 times per day for fever aches and pains. Robitussin-DM during the day and NyQuil at night. Her symptoms get worse with fever increasing shortness of breath let us know Influenza, Adult Influenza, more commonly known as "the flu," is a viral infection that primarily affects the respiratory tract. The respiratory tract includes organs that help you breathe, such as the lungs, nose, and throat. The flu causes many common cold symptoms, as well as a high fever and body aches. The flu spreads easily from person to person (is contagious). Getting a flu shot (influenza vaccination) every year is the best way to prevent influenza. What are the causes? Influenza is caused by a virus. You can catch the virus by:  Breathing in droplets from an infected person's cough or sneeze.  Touching something that was recently contaminated with the virus and then touching your mouth, nose, or eyes. What increases the risk? The following factors may make you more likely to get the flu:  Not cleaning your hands frequently with soap and water or alcohol-based hand sanitizer.  Having close contact with many people during cold and flu season.  Touching your mouth, eyes, or nose without washing or sanitizing your hands first.  Not drinking enough fluids or not eating a healthy diet.  Not getting enough sleep or exercise.  Being under a high amount of stress.  Not getting a yearly (annual) flu shot. You may be at a higher risk of complications from the flu, such as a severe lung infection (pneumonia), if you:  Are over the age of 56.  Are pregnant.  Have a weakened disease-fighting system (immune system). You may have a weakened immune system if you:  Have HIV or AIDS.  Are undergoing chemotherapy.  Aretaking medicines that reduce the activity of (suppress) the immune system.  Have a long-term (chronic) illness, such as heart disease, kidney disease, diabetes, or lung  disease.  Have a liver disorder.  Are obese.  Have anemia. What are the signs or symptoms? Symptoms of this condition typically last 4-10 days and may include:  Fever.  Chills.  Headache, body aches, or muscle aches.  Sore throat.  Cough.  Runny or congested nose.  Chest discomfort and cough.  Poor appetite.  Weakness or tiredness (fatigue).  Dizziness.  Nausea or vomiting. How is this diagnosed? This condition may be diagnosed based on your medical history and a physical exam. Your health care provider may do a nose or throat swab test to confirm the diagnosis. How is this treated? If influenza is detected early, you can be treated with antiviral medicine that can reduce the length of your illness and the severity of your symptoms. This medicine may be given by mouth (orally) or through an IV tube that is inserted in one of your veins. The goal of treatment is to relieve symptoms by taking care of yourself at home. This may include taking over-the-counter medicines, drinking plenty of fluids, and adding humidity to the air in your home. In some cases, influenza goes away on its own. Severe influenza or complications from influenza may be treated in a hospital. Follow these instructions at home:  Take over-the-counter and prescription medicines only as told by your health care provider.  Use a cool mist humidifier to add humidity to the air in your home. This can make breathing easier.  Rest as needed.  Drink enough fluid to keep your urine clear or pale yellow.  Cover your mouth and nose  when you cough or sneeze.  Wash your hands with soap and water often, especially after you cough or sneeze. If soap and water are not available, use hand sanitizer.  Stay home from work or school as told by your health care provider. Unless you are visiting your health care provider, try to avoid leaving home until your fever has been gone for 24 hours without the use of  medicine.  Keep all follow-up visits as told by your health care provider. This is important. How is this prevented?  Getting an annual flu shot is the best way to avoid getting the flu. You may get the flu shot in late summer, fall, or winter. Ask your health care provider when you should get your flu shot.  Wash your hands often or use hand sanitizer often.  Avoid contact with people who are sick during cold and flu season.  Eat a healthy diet, drink plenty of fluids, get enough sleep, and exercise regularly. Contact a health care provider if:  You develop new symptoms.  You have:  Chest pain.  Diarrhea.  A fever.  Your cough gets worse.  You produce more mucus.  You feel nauseous or you vomit. Get help right away if:  You develop shortness of breath or difficulty breathing.  Your skin or nails turn a bluish color.  You have severe pain or stiffness in your neck.  You develop a sudden headache or sudden pain in your face or ear.  You cannot stop vomiting. This information is not intended to replace advice given to you by your health care provider. Make sure you discuss any questions you have with your health care provider. Document Released: 06/20/2000 Document Revised: 11/29/2015 Document Reviewed: 04/17/2015 Elsevier Interactive Patient Education  2017 [employer]

## 2016-08-11 NOTE — Progress Notes (Signed)
   Subjective:    Patient ID: Jeremiah Bentley, male    DOB: May 10, 1986, 31 y.o.   MRN: [id number]  HPI 2 day history of started with nasal congestion, sore throat, PND but no fever or chills initially. He then became quite fatigued with worsening sore throat and PND. Is also complaining of coughing and fatigue as well as malaise. At the end of the interview he then mentioned continued difficulty with right shoulder pain and a clicking sensation. He did state that the initial injection did help and he is having less discomfort but still having a clicking sensation in the shoulder.   Review of Systems     Objective:   Physical Exam Alert and in no distress. Tympanic membranes and canals are normal. Pharyngeal area is normal. Neck is supple without adenopathy or thyromegaly. Cardiac exam shows a regular sinus rhythm without murmurs or gallops. Lungs are clear to auscultation. Right shoulder exam shows full motion. Negative drop arm test. Negative sulcus test. Neer's and Hawkins test was negative. No tenderness over the before acjoint or bicipital groove. Flu test negative      Assessment & Plan:  Influenza  Right shoulder pain, unspecified chronicity Recommend supportive care for the influenza. Discussed using Tamiflu risks and benefits. He declined. Recommend 800 mg ibuprofen 3 times per day as well as Robitussin-DM and possibly NyQuil. Also send for ultrasound of the shoulder. Discussed follow-up with this pending the ultrasound in regard to possible injection and/or physical therapy.

## 2016-08-12 LAB — POC INFLUENZA A&B (BINAX/QUICKVUE)
INFLUENZA B, POC: NEGATIVE
Influenza A, POC: NEGATIVE

## 2016-08-12 NOTE — Addendum Note (Signed)
Addended by: Minette HeadlandBENFIELD, Lakeisa Heninger L on: 08/12/2016 08:29 AM   Modules accepted: Orders

## 2016-08-12 NOTE — Addendum Note (Signed)
Addended by: Minette HeadlandBENFIELD, Madailein Londo L on: 08/12/2016 09:43 AM   Modules accepted: Orders

## 2016-08-14 NOTE — Addendum Note (Signed)
Addended by: Minette HeadlandBENFIELD, Ciji Boston L on: 08/14/2016 03:46 PM   Modules accepted: Orders

## 2016-08-15 NOTE — Progress Notes (Signed)
Order(s) created erroneously. Erroneous order ID: [id number]  Order moved by: Nashay Brickley M  Order move date/time: 08/15/2016 12:50 PM  Source Patient: [id number]  Source Contact: 08/11/2016  Destination Patient: [id number]  Destination Contact: 09/21/2012 

## 2016-08-21 ENCOUNTER — Other Ambulatory Visit: Payer: Self-pay

## 2016-08-21 ENCOUNTER — Ambulatory Visit
Admission: RE | Admit: 2016-08-21 | Discharge: 2016-08-21 | Disposition: A | Discharge status: Home / Self Care | Payer: Self-pay | Source: Ambulatory Visit | Attending: Family Medicine | Admitting: Family Medicine

## 2016-08-21 DIAGNOSIS — M25511 Pain in right shoulder: Secondary | ICD-10-CM

## 2016-09-10 ENCOUNTER — Encounter: Payer: Self-pay | Admitting: Family Medicine

## 2016-09-10 ENCOUNTER — Ambulatory Visit (INDEPENDENT_AMBULATORY_CARE_PROVIDER_SITE_OTHER): Payer: Managed Care, Other (non HMO) | Admitting: Family Medicine

## 2016-09-10 VITALS — BP 114/70 | HR 76 | Ht 68.0 in | Wt 193.0 lb

## 2016-09-10 DIAGNOSIS — M25511 Pain in right shoulder: Secondary | ICD-10-CM

## 2016-09-10 NOTE — Progress Notes (Signed)
   Subjective:    Patient ID: Jeremiah Bentley, male    DOB: 01-Apr-1986, 31 y.o.   MRN: [id number]  HPI Is here for a recheck. He did have a recent ultrasound which was essentially negative. Through a friend of his, he was taught some shoulder rehabilitation exercises and notes that although he is having a popping sensation, there is no pain.   Review of Systems     Objective:   Physical Exam Alert and in no distress. Abduction and internal as well as external rotation of both arms does create a popping sensation.       Assessment & Plan:  Right shoulder pain, unspecified chronicity - Plan: Ambulatory referral to Physical Therapy I explained that the popping sensation is not necessarily shoulder joint but probably tendon related. I will send him to physical therapy to ensure that he is being taught proper technique. We are both in agreement that no shots are really needed at this point.

## 2016-09-11 ENCOUNTER — Ambulatory Visit: Payer: 59 | Attending: Family Medicine | Admitting: Physical Therapy

## 2017-05-01 ENCOUNTER — Encounter: Payer: Self-pay | Admitting: Family Medicine

## 2017-05-01 ENCOUNTER — Ambulatory Visit (INDEPENDENT_AMBULATORY_CARE_PROVIDER_SITE_OTHER): Payer: Managed Care, Other (non HMO) | Admitting: Family Medicine

## 2017-05-01 ENCOUNTER — Telehealth: Payer: Self-pay

## 2017-05-01 VITALS — BP 110/70 | HR 62 | Resp 18 | Ht 68.0 in | Wt 200.6 lb

## 2017-05-01 DIAGNOSIS — M779 Enthesopathy, unspecified: Principal | ICD-10-CM

## 2017-05-01 DIAGNOSIS — M778 Other enthesopathies, not elsewhere classified: Secondary | ICD-10-CM

## 2017-05-01 NOTE — Progress Notes (Signed)
   Subjective:    Patient ID: Jeremiah Bentley, male    DOB: 1986-04-07, 31 y.o.   MRN: [id number]  HPI He complains of a several day history of right index finger pain in the MCP joint. No other joints are involved. No history of injury to this. No history of overuse   Review of Systems     Objective:   Physical Exam Exam of the right index finger shows tenderness to palpation over the palmar surface of the MCP joint but not over the dorsal surface. Pain was elicited on extension of the finger but not with flexion. Stabilizing the joint and moving the tendon full extension causes difficulty. No swelling or discomfort to any of the other joints in the finger. No tenderness to the wrist and elbow or knees.       Assessment & Plan:  Tendinitis of flexor tendon of right hand Recommend 4 ibuprofen 3 times per day and if continued difficulty, I will refer to hand surgery.

## 2017-05-01 NOTE — Telephone Encounter (Signed)
CMA call regarding to clarify about medication   Patient was aware and understood

## 2017-08-17 ENCOUNTER — Encounter: Payer: Self-pay | Admitting: Family Medicine

## 2017-08-17 ENCOUNTER — Ambulatory Visit: Payer: Managed Care, Other (non HMO) | Admitting: Family Medicine

## 2017-08-17 VITALS — BP 118/70 | HR 80 | Temp 98.5°F | Resp 16 | Wt 199.2 lb

## 2017-08-17 DIAGNOSIS — R059 Cough, unspecified: Secondary | ICD-10-CM

## 2017-08-17 DIAGNOSIS — R05 Cough: Secondary | ICD-10-CM

## 2017-08-17 DIAGNOSIS — J029 Acute pharyngitis, unspecified: Secondary | ICD-10-CM

## 2017-08-17 LAB — POCT RAPID STREP A (OFFICE): Rapid Strep A Screen: NEGATIVE

## 2017-08-17 NOTE — Progress Notes (Signed)
Chief Complaint  Patient presents with  . cough    drainage causing sore throat, cough, last 3 days    Subjective:  Jeremiah Bentley is a 32 y.o. male who presents for a 3 day history of sore throat, post nasal drainage, frontal headache, ears feeling full, cough that is mainly dry.   Denies fever, chills, body aches, rhinorrhea, nasal congestion.   He smokes but switched to electronic cigarettes 3-4 weeks ago.   Treatment to date: antihistamines, cough suppressants and decongestants.  Denies sick contacts.  No other aggravating or relieving factors.  No other c/o.  ROS as in subjective.   Objective: Vitals:   08/17/17 1055  BP: 118/70  Pulse: 80  Resp: 16  Temp: 98.5 F (36.9 C)  SpO2: 98%    General appearance: Alert, WD/WN, no distress, mildly ill appearing                             Skin: warm, no rash                           Head: no sinus tenderness                            Eyes: conjunctiva normal, corneas clear, PERRLA                            Ears: pearly TMs, external ear canals normal                          Nose: septum midline, turbinates swollen, with erythema and clear discharge             Mouth/throat: MMM, tongue normal, mild pharyngeal erythema                           Neck: supple, no adenopathy, no thyromegaly, nontender                          Heart: RRR, normal S1, S2, no murmurs                         Lungs: CTA bilaterally, no wheezes, rales, or rhonchi      Assessment: Acute pharyngitis, unspecified etiology - Plan: POCT rapid strep A  Cough    Plan: Rapid strep swab: negative  Discussed diagnosis and treatment of URI.  Suggested symptomatic OTC remedies. Nasal saline spray for congestion.  Tylenol or Ibuprofen OTC for fever and malaise.  Call/return in 2-3 days if symptoms aren't resolving.

## 2017-08-17 NOTE — Patient Instructions (Signed)
Your strep swab is negative.  I suspect your symptoms are related to a viral illness and recommend treating your symptoms for now.   Mucinex DM for congestion and cough, drink extra water, use salt water gargles for throat irritation and Tylenol or Ibuprofen for aches and pains.  Call if you are not improving by days 7-10 of your illness or if you develop fever, wheezing or worsening symptoms.

## 2017-12-09 ENCOUNTER — Encounter: Payer: Self-pay | Admitting: Family Medicine

## 2017-12-09 ENCOUNTER — Ambulatory Visit: Payer: Managed Care, Other (non HMO) | Admitting: Family Medicine

## 2017-12-09 VITALS — BP 110/70 | HR 80 | Temp 98.2°F | Resp 16 | Wt 197.2 lb

## 2017-12-09 DIAGNOSIS — R6883 Chills (without fever): Secondary | ICD-10-CM

## 2017-12-09 DIAGNOSIS — R42 Dizziness and giddiness: Secondary | ICD-10-CM

## 2017-12-09 DIAGNOSIS — R52 Pain, unspecified: Secondary | ICD-10-CM

## 2017-12-09 DIAGNOSIS — R61 Generalized hyperhidrosis: Secondary | ICD-10-CM | POA: Diagnosis not present

## 2017-12-09 NOTE — Progress Notes (Signed)
   Subjective:    Patient ID: Jeremiah Bentley, male    DOB: 1985/12/11, 32 y.o.   MRN: [id number]  HPI Chief Complaint  Patient presents with  . nausea    nauseated, sweating, weak, freezing- started yesterday, joint aches   He is here with complaints of a 2 day history of body aches, fatigue, chills, episodes of sweating and feeling "overheated". States he had an episode of feeling lightheaded yesterday but no syncope. No vision changes.   States he is feeling better overall today but still tired and sweating.   Denies fever, ear pain, rhinorrhea, nasal congestion, sore throat, neck pain, chest pain, palpitations, shortness of breath, cough, abdominal pain, N/V/D, urinary symptoms, LE edema.   He has not seen a tick. No rash or headache.   He found out his girlfriend is pregnant last night.   Reviewed allergies, medications, past medical, surgical, family, and social history.   Review of Systems Pertinent positives and negatives in the history of present illness.     Objective:   Physical Exam BP 110/70   Pulse 80   Temp 98.2 F (36.8 C) (Oral)   Resp 16   Wt 197 lb 3.2 oz (89.4 kg)   SpO2 99%   BMI 29.98 kg/m   Alert and in no distress. No sinus tenderness. Tympanic membranes and canals are normal. Pharyngeal area is normal. Neck is supple without adenopathy or thyromegaly. Cardiac exam shows a regular sinus rhythm without murmurs or gallops. Lungs are clear to auscultation. Abdomen soft, non distended, non tender. Extremities without edema, pulses intact.        Assessment & Plan:  Body aches  Chills without fever - Plan: CBC with Differential/Platelet, Comprehensive metabolic panel  Excessive sweating - Plan: CBC with Differential/Platelet, Comprehensive metabolic panel, TSH  Episode of dizziness - Plan: CBC with Differential/Platelet, Comprehensive metabolic panel  Discussed that most likely this is a viral syndrome. He is already noticing improvement in  symptoms. Discussed possible etiologies including tick borne illness but very unlikely since he has not seen a tick, no fever, headache, rash.  He would like to have labs checked.  Will check CBC, CMP and TSH. Follow up if he is noticing any new or worsening symptoms.

## 2017-12-09 NOTE — Patient Instructions (Signed)
Stay well hydrated. Take Tylenol or Ibuprofen as needed for aches and pain.   If you develop any new or worsening symptoms then let me know.   We will call you with your lab results.

## 2017-12-10 LAB — TSH: TSH: 1.18 u[IU]/mL (ref 0.450–4.500)

## 2017-12-10 LAB — CBC WITH DIFFERENTIAL/PLATELET
BASOS: 1 %
Basophils Absolute: 0 10*3/uL (ref 0.0–0.2)
EOS (ABSOLUTE): 0 10*3/uL (ref 0.0–0.4)
Eos: 0 %
Hematocrit: 44.5 % (ref 37.5–51.0)
Hemoglobin: 15.6 g/dL (ref 13.0–17.7)
IMMATURE GRANS (ABS): 0 10*3/uL (ref 0.0–0.1)
IMMATURE GRANULOCYTES: 0 %
LYMPHS: 37 %
Lymphocytes Absolute: 1.9 10*3/uL (ref 0.7–3.1)
MCH: 30.1 pg (ref 26.6–33.0)
MCHC: 35.1 g/dL (ref 31.5–35.7)
MCV: 86 fL (ref 79–97)
Monocytes Absolute: 0.5 10*3/uL (ref 0.1–0.9)
Monocytes: 9 %
NEUTROS ABS: 2.7 10*3/uL (ref 1.4–7.0)
Neutrophils: 53 %
PLATELETS: 188 10*3/uL (ref 150–450)
RBC: 5.19 x10E6/uL (ref 4.14–5.80)
RDW: 13.3 % (ref 12.3–15.4)
WBC: 5.2 10*3/uL (ref 3.4–10.8)

## 2017-12-10 LAB — COMPREHENSIVE METABOLIC PANEL
A/G RATIO: 2 (ref 1.2–2.2)
ALT: 48 IU/L — ABNORMAL HIGH (ref 0–44)
AST: 28 IU/L (ref 0–40)
Albumin: 4.4 g/dL (ref 3.5–5.5)
Alkaline Phosphatase: 56 IU/L (ref 39–117)
BILIRUBIN TOTAL: 0.4 mg/dL (ref 0.0–1.2)
BUN/Creatinine Ratio: 8 — ABNORMAL LOW (ref 9–20)
BUN: 8 mg/dL (ref 6–20)
CALCIUM: 9.1 mg/dL (ref 8.7–10.2)
CHLORIDE: 105 mmol/L (ref 96–106)
CO2: 21 mmol/L (ref 20–29)
Creatinine, Ser: 1.05 mg/dL (ref 0.76–1.27)
GFR, EST AFRICAN AMERICAN: 110 mL/min/{1.73_m2} (ref >59)
GFR, EST NON AFRICAN AMERICAN: 95 mL/min/{1.73_m2} (ref >59)
Globulin, Total: 2.2 g/dL (ref 1.5–4.5)
Glucose: 97 mg/dL (ref 65–99)
POTASSIUM: 3.8 mmol/L (ref 3.5–5.2)
SODIUM: 141 mmol/L (ref 134–144)
TOTAL PROTEIN: 6.6 g/dL (ref 6.0–8.5)

## 2017-12-11 LAB — HEPATITIS PANEL, ACUTE
HEP A IGM: NEGATIVE
HEP B C IGM: NEGATIVE
HEP B S AG: NEGATIVE

## 2017-12-11 LAB — SPECIMEN STATUS REPORT

## 2018-01-19 ENCOUNTER — Ambulatory Visit: Payer: Managed Care, Other (non HMO) | Admitting: Family Medicine

## 2018-01-19 DIAGNOSIS — R748 Abnormal levels of other serum enzymes: Secondary | ICD-10-CM

## 2018-01-19 NOTE — Progress Notes (Signed)
   Subjective:    Patient ID: Jeremiah Bentley, male    DOB: 1986-05-18, 32 y.o.   MRN: [id number]  HPI Chief Complaint  Patient presents with  . follow-u    follow-up on liver function test   Unable to see patient due to suspicious odor in building and need to evacuate. He will return for hepatic function labs.  Will refund co-pay  Review of Systems     Objective:   Physical Exam BP 110/80   Pulse 67   Temp 97.8 F (36.6 C) (Oral)   Wt 194 lb (88 kg)   SpO2 98%   BMI 29.50 kg/m         Assessment & Plan:

## 2018-01-19 NOTE — Addendum Note (Signed)
Addended by: Herminio CommonsJOHNSON, Natilie Krabbenhoft A on: 01/19/2018 03:52 PM   Modules accepted: Orders

## 2018-01-20 LAB — HEPATIC FUNCTION PANEL
ALT: 40 IU/L (ref 0–44)
AST: 22 IU/L (ref 0–40)
Albumin: 4.6 g/dL (ref 3.5–5.5)
Alkaline Phosphatase: 50 IU/L (ref 39–117)
BILIRUBIN TOTAL: 0.4 mg/dL (ref 0.0–1.2)
Bilirubin, Direct: 0.1 mg/dL (ref 0.00–0.40)
Total Protein: 6.8 g/dL (ref 6.0–8.5)

## 2018-07-08 ENCOUNTER — Ambulatory Visit: Payer: 59 | Admitting: Family Medicine

## 2018-07-08 ENCOUNTER — Encounter: Payer: Self-pay | Admitting: Family Medicine

## 2018-07-08 VITALS — BP 122/80 | HR 89 | Temp 98.2°F | Wt 193.2 lb

## 2018-07-08 DIAGNOSIS — R509 Fever, unspecified: Secondary | ICD-10-CM

## 2018-07-08 DIAGNOSIS — J111 Influenza due to unidentified influenza virus with other respiratory manifestations: Secondary | ICD-10-CM

## 2018-07-08 LAB — POCT INFLUENZA A/B
INFLUENZA A, POC: NEGATIVE
Influenza B, POC: NEGATIVE

## 2018-07-08 NOTE — Progress Notes (Signed)
   Subjective:    Patient ID: Jeremiah Bentley, male    DOB: Apr 14, 1986, 33 y.o.   MRN: [id number]  HPI He complains of the quick onset of fever, chills, weakness, malaise started last night and continues to today.   Review of Systems     Objective:   Physical Exam Alert and in no distress. Tympanic membranes and canals are normal. Pharyngeal area is normal. Neck is supple without adenopathy or thyromegaly. Cardiac exam shows a regular sinus rhythm without murmurs or gallops. Lungs are clear to auscultation. Flu test negative       Assessment & Plan:  Fever and chills - Plan: Influenza A/B  Flu syndrome Although the flu test was negative, I think he still indeed does have the flu and recommend he treated appropriately.  Recommend avoiding going back to work until his fever has passed for 24 hours.

## 2018-09-10 ENCOUNTER — Ambulatory Visit (INDEPENDENT_AMBULATORY_CARE_PROVIDER_SITE_OTHER): Payer: 59 | Admitting: Family Medicine

## 2018-09-10 ENCOUNTER — Encounter: Payer: Self-pay | Admitting: Family Medicine

## 2018-09-10 VITALS — BP 120/80 | HR 56 | Temp 98.7°F | Wt 185.0 lb

## 2018-09-10 DIAGNOSIS — F410 Panic disorder [episodic paroxysmal anxiety] without agoraphobia: Secondary | ICD-10-CM

## 2018-09-10 MED ORDER — ALPRAZOLAM 0.25 MG PO TABS: 0.2500 mg | ORAL_TABLET | Freq: Two times a day (BID) | ORAL | 0 refills | Status: DC | PRN

## 2018-09-10 NOTE — Progress Notes (Signed)
   Subjective:    Patient ID: Jeremiah Bentley, male    DOB: 1985/07/21, 33 y.o.   MRN: [id number]  HPI He is here for evaluation of possible panic symptoms.  In November he had an onset of rapid heart rate, anxiety and shortness of breath while driving that lasted roughly 15 minutes and then went away.  He had another episode in January but this was at work with rapid heart rate and anxiety which lasted 10 minutes.  In January he had another episode again lasting a very short period of time.  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  No other major stressors identified.  There is a family history of anxiety.   Review of Systems     Objective:   Physical Exam Alert and in no distress otherwise not examined       Assessment & Plan:  Panic attacks - Plan: ALPRAZolam (XANAX) 0.25 MG tablet I explained that these are indeed panic attacks but the exact reason is unclear.  Did give Xanax but explained that the Xanax would not work quickly for his symptoms.  Will refer for further counseling and evaluation.  He is to return here in April for a complete exam and recheck on his panic symptoms.

## 2018-12-17 ENCOUNTER — Ambulatory Visit: Payer: 59 | Admitting: Family Medicine

## 2018-12-17 ENCOUNTER — Other Ambulatory Visit: Payer: Self-pay

## 2018-12-17 ENCOUNTER — Encounter: Payer: Self-pay | Admitting: Family Medicine

## 2018-12-17 VITALS — BP 120/84 | HR 76 | Temp 98.2°F | Ht 69.0 in | Wt 189.9 lb

## 2018-12-17 DIAGNOSIS — Z0000 Encounter for general adult medical examination without abnormal findings: Secondary | ICD-10-CM | POA: Diagnosis not present

## 2018-12-17 DIAGNOSIS — I4510 Unspecified right bundle-branch block: Secondary | ICD-10-CM

## 2018-12-17 DIAGNOSIS — F17200 Nicotine dependence, unspecified, uncomplicated: Secondary | ICD-10-CM

## 2018-12-17 DIAGNOSIS — R000 Tachycardia, unspecified: Secondary | ICD-10-CM

## 2018-12-17 NOTE — Progress Notes (Signed)
Subjective:    Patient ID: Jeremiah Bentley, male    DOB: 17-Mar-1986, 33 y.o.   MRN: [id number]  HPI He is here for a complete examination.  He now describes having intermittent episodes of chest tightness followed by rapid heart rate with occasional tingling into his left arm.  He notes a heart rate going up to about 170.  This was documented on his watch.  This can occur at a time including when he is quiet and in a good spot.  It can last roughly 1 to 1-1/2 hours.  No to chest pain, shortness of breath or diaphoresis.  He has started smoking and again and does plan to quit sometime soon.  His alcohol consumption is minimal.  He does occasionally smoke marijuana.  Psychologically things seem to be going well for him.  He has had no skin or hair changes.   Review of Systems  All other systems reviewed and are negative.      Objective:   Physical Exam BP 120/84 (BP Location: Left Arm, Patient Position: Sitting)   Pulse 76   Temp 98.2 F (36.8 C)   Ht 5\' 9"  (1.753 m)   Wt 189 lb 14.4 oz (86.1 kg)   SpO2 97%   BMI 28.04 kg/m   General Appearance:    Alert, cooperative, no distress, appears stated age  Head:    Normocephalic, without obvious abnormality, atraumatic  Eyes:    PERRL, conjunctiva/corneas clear, EOM's intact, fundi    benign  Ears:    Normal TM's and external ear canals  Nose:   Nares normal, mucosa normal, no drainage or sinus   tenderness  Throat:   Lips, mucosa, and tongue normal; teeth and gums normal  Neck:   Supple, no lymphadenopathy;  thyroid:  no   enlargement/tenderness/nodules; no carotid   bruit or JVD  Back:    Spine nontender, no curvature, ROM normal, no CVA     tenderness  Lungs:     Clear to auscultation bilaterally without wheezes, rales or     ronchi; respirations unlabored  Chest Wall:    No tenderness or deformity   Heart:    Regular rate and rhythm, S1 and S2 normal, no murmur, rub   or gallop     Abdomen:     Soft, non-tender, nondistended,  normoactive bowel sounds,    no masses, no hepatosplenomegaly  Genitalia:    Normal male external genitalia without lesions.  Testicles without masses.  No inguinal hernias.  Rectal:   Deferred due to age <40 and lack of symptoms  Extremities:   No clubbing, cyanosis or edema  Pulses:   2+ and symmetric all extremities  Skin:   Skin color, texture, turgor normal, recent sunburn noted on his upper back.  Lymph nodes:   Cervical, supraclavicular, and axillary nodes normal  Neurologic:   CNII-XII intact, normal strength, sensation and gait; reflexes 2+ and symmetric throughout          Psych:   Normal mood, affect, hygiene and grooming.   EKG shows incomplete RBBB      Assessment & Plan:  Routine general medical examination at a health care facility - Plan: CBC with Differential/Platelet, Comprehensive metabolic panel, Lipid panel, TSH, .  Tachycardia - Plan: CBC with Differential/Platelet, Comprehensive metabolic panel, Lipid panel, TSH, EKG 12-Lead, I will order an event monitor on him and probably refer to cardiology after I get that back.  Incomplete RBBB -   Current smoker -  Plan: He will call if he wants help with smoking cessation.

## 2018-12-18 LAB — CBC WITH DIFFERENTIAL/PLATELET
Basophils Absolute: 0.1 10*3/uL (ref 0.0–0.2)
Basos: 1 %
EOS (ABSOLUTE): 0.2 10*3/uL (ref 0.0–0.4)
Eos: 2 %
Hematocrit: 45.7 % (ref 37.5–51.0)
Hemoglobin: 16.5 g/dL (ref 13.0–17.7)
Immature Grans (Abs): 0 10*3/uL (ref 0.0–0.1)
Immature Granulocytes: 0 %
Lymphocytes Absolute: 3.6 10*3/uL — ABNORMAL HIGH (ref 0.7–3.1)
Lymphs: 29 %
MCH: 30.8 pg (ref 26.6–33.0)
MCHC: 36.1 g/dL — ABNORMAL HIGH (ref 31.5–35.7)
MCV: 85 fL (ref 79–97)
Monocytes Absolute: 0.6 10*3/uL (ref 0.1–0.9)
Monocytes: 5 %
Neutrophils Absolute: 7.9 10*3/uL — ABNORMAL HIGH (ref 1.4–7.0)
Neutrophils: 63 %
Platelets: 289 10*3/uL (ref 150–450)
RBC: 5.36 x10E6/uL (ref 4.14–5.80)
RDW: 11.8 % (ref 11.6–15.4)
WBC: 12.5 10*3/uL — ABNORMAL HIGH (ref 3.4–10.8)

## 2018-12-18 LAB — COMPREHENSIVE METABOLIC PANEL
ALT: 20 IU/L (ref 0–44)
AST: 18 IU/L (ref 0–40)
Albumin/Globulin Ratio: 2.4 — ABNORMAL HIGH (ref 1.2–2.2)
Albumin: 4.8 g/dL (ref 4.1–5.2)
Alkaline Phosphatase: 48 IU/L (ref 39–117)
BUN/Creatinine Ratio: 9 (ref 9–20)
BUN: 9 mg/dL (ref 6–20)
Bilirubin Total: 0.4 mg/dL (ref 0.0–1.2)
CO2: 23 mmol/L (ref 20–29)
Calcium: 9.6 mg/dL (ref 8.7–10.2)
Chloride: 106 mmol/L (ref 96–106)
Creatinine, Ser: 0.95 mg/dL (ref 0.76–1.27)
GFR calc Af Amer: 124 mL/min/{1.73_m2} (ref >59)
GFR calc non Af Amer: 107 mL/min/{1.73_m2} (ref >59)
Globulin, Total: 2 g/dL (ref 1.5–4.5)
Glucose: 90 mg/dL (ref 65–99)
Potassium: 4.5 mmol/L (ref 3.5–5.2)
Sodium: 145 mmol/L — ABNORMAL HIGH (ref 134–144)
Total Protein: 6.8 g/dL (ref 6.0–8.5)

## 2018-12-18 LAB — LIPID PANEL
Chol/HDL Ratio: 5.9 ratio — ABNORMAL HIGH (ref 0.0–5.0)
Cholesterol, Total: 201 mg/dL — ABNORMAL HIGH (ref 100–199)
HDL: 34 mg/dL — ABNORMAL LOW (ref >39)
LDL Calculated: 144 mg/dL — ABNORMAL HIGH (ref 0–99)
Triglycerides: 116 mg/dL (ref 0–149)
VLDL Cholesterol Cal: 23 mg/dL (ref 5–40)

## 2018-12-18 LAB — TSH: TSH: 1.17 u[IU]/mL (ref 0.450–4.500)

## 2018-12-22 ENCOUNTER — Telehealth: Payer: Self-pay | Admitting: *Deleted

## 2018-12-22 NOTE — Telephone Encounter (Signed)
[employer] to ship a 30 day cardiac event monitor to his home.  Instructions reviewed briefly as they are included in the monitor kit.  Patient did not have insurance card on him, but stated his plan may have changed from what we have on file.  I will enroll with [employer] with insurance information we have on file.  Patient instructed to call [employer] at [phone] to confirm correct insurance information once available.

## 2018-12-25 ENCOUNTER — Emergency Department (HOSPITAL_COMMUNITY)
Admission: EM | Admit: 2018-12-25 | Discharge: 2018-12-25 | Disposition: A | Discharge status: Home / Self Care | Payer: 59 | Attending: Emergency Medicine | Admitting: Emergency Medicine

## 2018-12-25 ENCOUNTER — Emergency Department (HOSPITAL_COMMUNITY): Payer: 59

## 2018-12-25 ENCOUNTER — Encounter (HOSPITAL_COMMUNITY): Payer: Self-pay

## 2018-12-25 ENCOUNTER — Other Ambulatory Visit: Payer: Self-pay

## 2018-12-25 DIAGNOSIS — F17210 Nicotine dependence, cigarettes, uncomplicated: Secondary | ICD-10-CM | POA: Insufficient documentation

## 2018-12-25 DIAGNOSIS — Z7982 Long term (current) use of aspirin: Secondary | ICD-10-CM | POA: Diagnosis not present

## 2018-12-25 DIAGNOSIS — R002 Palpitations: Secondary | ICD-10-CM | POA: Diagnosis present

## 2018-12-25 LAB — CBC
HCT: 49.5 % (ref 39.0–52.0)
Hemoglobin: 17.1 g/dL — ABNORMAL HIGH (ref 13.0–17.0)
MCH: 30.6 pg (ref 26.0–34.0)
MCHC: 34.5 g/dL (ref 30.0–36.0)
MCV: 88.7 fL (ref 80.0–100.0)
Platelets: 306 10*3/uL (ref 150–400)
RBC: 5.58 MIL/uL (ref 4.22–5.81)
RDW: 11.9 % (ref 11.5–15.5)
WBC: 9.9 10*3/uL (ref 4.0–10.5)
nRBC: 0 % (ref 0.0–0.2)

## 2018-12-25 LAB — BASIC METABOLIC PANEL
Anion gap: 10 (ref 5–15)
BUN: 8 mg/dL (ref 6–20)
CO2: 22 mmol/L (ref 22–32)
Calcium: 9.9 mg/dL (ref 8.9–10.3)
Chloride: 110 mmol/L (ref 98–111)
Creatinine, Ser: 0.93 mg/dL (ref 0.61–1.24)
GFR calc Af Amer: >60 mL/min (ref >60)
GFR calc non Af Amer: >60 mL/min (ref >60)
Glucose, Bld: 106 mg/dL — ABNORMAL HIGH (ref 70–99)
Potassium: 3.8 mmol/L (ref 3.5–5.1)
Sodium: 142 mmol/L (ref 135–145)

## 2018-12-25 NOTE — ED Triage Notes (Signed)
He reports feeling occasional, brief, self-limiting episodes of palpitations since Jan. Of this year. He has recently seen his pcp for this problem, and cardiology with Holter monitoring has been recommended, but not begun as of yet. He is here today with a c/o of feeling palpitations which lasted about 1/2 hour this morning, and with his smart watch his pulse registered at ~160/min. He is in no distress. His skin is normal, warm and dry and he is breathing normally. He denies any pain or discomfort. EKG performed upon arrival to room 10.

## 2018-12-25 NOTE — Discharge Instructions (Addendum)
Come to the ER for any worsening or concerning symptoms. Follow up with your doctor, event monitor as planned.

## 2018-12-25 NOTE — ED Provider Notes (Signed)
[facility] DEPT Provider Note   CSN: [id number] Arrival date & time: 12/25/18  5188    History   Chief Complaint Chief Complaint  Patient presents with  . Palpitations    HPI Jeremiah Bentley is a 33 y.o. male.     33yo male with complaint of heart racing today. Reports history of similar episodes previously and undergoing work up with PCP (normal labs including CBC, CMP, TSH), awaiting event monitor which should arrive in the mail Monday. Patient states he was driving down the road today when he felt his heart racing, he then began to feel tight in his chest with [facility] and sweaty palms. Patient came to the ER in hopes of obtaining an EKG while symptomatic however his symptoms resolved after 30 minutes/by arrival in the ER. Episode today is similar to the episodes he has experienced previously. PCP diagnosed with anxiety and prescribed Xanax. Patient has a heart rate monitor on his watch, states pulse as high as 170s during an event, as low as upper 50s while resting. Denies caffeine use, stress, emotional event leading up to heart racing episodes. No identifiable triggers. No other complaints or concerns.      Past Medical History:  Diagnosis Date  . Smoker     There are no active problems to display for this patient.   History reviewed. No pertinent surgical history.      Home Medications    Prior to Admission medications   Medication Sig Start Date End Date Taking? Authorizing Provider  Aspirin-Acetaminophen-Caffeine (GOODYS EXTRA STRENGTH) [phone] MG PACK Take 1 Package by mouth daily as needed (headache).   Yes [provider]  ALPRAZolam (XANAX) 0.25 MG tablet Take 1 tablet (0.25 mg total) by mouth 2 (two) times daily as needed for anxiety. Patient not taking: Reported on 12/25/2018 09/10/18   Denita Lung, MD    Family History Family History  Problem Relation Age of Onset  . Hypertension Father     Social History  Social History   Tobacco Use  . Smoking status: Current Every Day Smoker    Types: E-cigarettes, Cigarettes  . Smokeless tobacco: Never Used  . Tobacco comment: smoked 1PPD, quit, restarted; now smoking 1 pack/week.  using e-cigs  Substance Use Topics  . Alcohol use: Yes    Comment: 8 drinks per week.  . Drug use: Yes    Types: Marijuana     Allergies   Patient has no known allergies.   Review of Systems Review of Systems  Constitutional: Negative for chills, diaphoresis, fatigue and fever.  Respiratory: Positive for chest tightness and shortness of breath.   Cardiovascular: Positive for palpitations.  Gastrointestinal: Positive for nausea. Negative for vomiting.  Skin: Negative for rash and wound.  Allergic/Immunologic: Negative for immunocompromised state.  Neurological: Negative for dizziness and weakness.  Psychiatric/Behavioral: Negative for confusion.  All other systems reviewed and are negative.    Physical Exam Updated Vital Signs BP 116/80   Pulse (!) 57   Temp 98.6 F (37 C) (Oral)   Resp 17   Ht 6' (1.829 m)   Wt 86.1 kg   SpO2 98%   BMI 25.74 kg/m   Physical Exam Vitals signs and nursing note reviewed.  Constitutional:      General: He is not in acute distress.    Appearance: He is well-developed. He is not diaphoretic.  HENT:     Head: Normocephalic and atraumatic.  Cardiovascular:     Rate and  Rhythm: Normal rate and regular rhythm.     Pulses: Normal pulses.     Heart sounds: Normal heart sounds.  Pulmonary:     Effort: Pulmonary effort is normal.     Breath sounds: Normal breath sounds.  Skin:    General: Skin is warm and dry.     Findings: No erythema or rash.  Neurological:     Mental Status: He is alert and oriented to person, place, and time.  Psychiatric:        Mood and Affect: Mood and affect normal. Mood is not anxious.        Behavior: Behavior normal.      ED Treatments / Results  Labs (all labs ordered are listed,  but only abnormal results are displayed) Labs Reviewed  BASIC METABOLIC PANEL - Abnormal; Notable for the following components:      Result Value   Glucose, Bld 106 (*)    All other components within normal limits  CBC - Abnormal; Notable for the following components:   Hemoglobin 17.1 (*)    All other components within normal limits    EKG EKG Interpretation  Date/Time:  Saturday December 25 2018 10:03:59 EDT Ventricular Rate:  101 PR Interval:    QRS Duration: 109 QT Interval:  339 QTC Calculation: 440 R Axis:   88 Text Interpretation:  Fast sinus arrhythmia RSR' in V1 or V2, right VCD or RVH Confirmed by Raeford RazorKohut, Stephen [phone]([id number]) on 12/25/2018 11:31:07 AM   Radiology Dg Chest 2 View  Result Date: 12/25/2018 CLINICAL DATA:  Palpitations EXAM: CHEST - 2 VIEW COMPARISON:  None. FINDINGS: Normal heart size. Normal mediastinal contour. No pneumothorax. No pleural effusion. Lungs appear clear, with no acute consolidative airspace disease and no pulmonary edema. IMPRESSION: No active cardiopulmonary disease. Electronically Signed   By: Delbert PhenixJason A Poff M.D.   On: 12/25/2018 11:24    Procedures Procedures (including critical care time)  Medications Ordered in ED Medications - No data to display   Initial Impression / Assessment and Plan / ED Course  I have reviewed the triage vital signs and the nursing notes.  Pertinent labs & imaging results that were available during my care of the patient were reviewed by me and considered in my medical decision making (see chart for details).  Clinical Course as of Dec 25 1239  Sat Dec 25, 2018  1238 32yo male with history of palpations, currently undergoing evaluation with his PCP and awaiting event monitor. Patient states he developed another episode today while driving and came to the ER in hopes of obtaining and EKG during the episode however the episode lasted 30 min compared to normal/previous 2 hour events and resolved upon arrival in the ER.  Patient denies feeling anxious prior to events, does feel anxious when his heart is racing. EKG in triage with rate of 101, has remained within normal limits while in the ER. CBC and BMP unremarkable, CXR normal. Plan is to follow up with PCP/event monitor as previously planned, return to ER for any new or worsening symptoms.    [LM]    Clinical Course User Index [LM] Jeannie FendMurphy, Jestina Stephani A, PA-C      Final Clinical Impressions(s) / ED Diagnoses   Final diagnoses:  Palpitations    ED Discharge Orders    None       Alden HippMurphy, Nathanuel Cabreja A, PA-C 12/25/18 1241    Raeford RazorKohut, Stephen, MD 12/26/18 [phone]

## 2019-01-04 ENCOUNTER — Encounter (INDEPENDENT_AMBULATORY_CARE_PROVIDER_SITE_OTHER): Payer: 59

## 2019-01-04 DIAGNOSIS — R000 Tachycardia, unspecified: Secondary | ICD-10-CM | POA: Diagnosis not present

## 2019-02-09 ENCOUNTER — Other Ambulatory Visit: Payer: Self-pay

## 2019-02-15 ENCOUNTER — Ambulatory Visit: Payer: 59 | Admitting: Family Medicine

## 2019-02-15 ENCOUNTER — Encounter: Payer: Self-pay | Admitting: Family Medicine

## 2019-02-15 ENCOUNTER — Other Ambulatory Visit: Payer: Self-pay

## 2019-02-15 VITALS — BP 124/82 | HR 88 | Temp 98.4°F | Wt 201.4 lb

## 2019-02-15 DIAGNOSIS — R000 Tachycardia, unspecified: Secondary | ICD-10-CM | POA: Diagnosis not present

## 2019-02-15 MED ORDER — METOPROLOL SUCCINATE ER 25 MG PO TB24: 25.0000 mg | ORAL_TABLET | Freq: Every day | ORAL | 3 refills | Status: DC

## 2019-02-15 NOTE — Progress Notes (Signed)
   Subjective:    Patient ID: Margaretha Seeds, male    DOB: [birth year], 33 y.o.   MRN: [id number]  HPI He is here for consult concerning recent cardiac monitoring.  The cardiac monitoring did show evidence of tachycardia.  We then discussed the amount of times that he has had this.  He does not get them very often but is uncomfortable when they do occur.   Review of Systems     Objective:   Physical Exam Alert and in no distress otherwise not examined       Assessment & Plan:  Tachycardia - Plan: metoprolol succinate (TOPROL-XL) 25 MG 24 hr tablet, after discussion with him, he is decided to be placed on a low dose beta-blocker to see if this will help take care of his symptoms.  He will keep me informed concerning this.

## 2019-04-01 ENCOUNTER — Encounter: Payer: Self-pay | Admitting: Medical

## 2019-04-01 ENCOUNTER — Ambulatory Visit: Payer: 59 | Admitting: Medical

## 2019-04-01 ENCOUNTER — Other Ambulatory Visit: Payer: Self-pay

## 2019-04-01 VITALS — BP 112/80 | HR 73 | Temp 97.7°F | Ht 68.0 in | Wt 188.6 lb

## 2019-04-01 DIAGNOSIS — Z202 Contact with and (suspected) exposure to infections with a predominantly sexual mode of transmission: Secondary | ICD-10-CM | POA: Insufficient documentation

## 2019-04-01 DIAGNOSIS — F329 Major depressive disorder, single episode, unspecified: Secondary | ICD-10-CM | POA: Diagnosis not present

## 2019-04-01 DIAGNOSIS — R4589 Other symptoms and signs involving emotional state: Secondary | ICD-10-CM | POA: Insufficient documentation

## 2019-04-01 DIAGNOSIS — Z639 Problem related to primary support group, unspecified: Secondary | ICD-10-CM | POA: Diagnosis not present

## 2019-04-01 DIAGNOSIS — Z113 Encounter for screening for infections with a predominantly sexual mode of transmission: Secondary | ICD-10-CM

## 2019-04-01 NOTE — Progress Notes (Signed)
Subjective: Chief Complaint  Patient presents with  . Exposure to STD    wants to be tested   Here for concerns for STD screen.  He notes that he has been in a relationship with the same girl since March 2020.  He has no symptoms at all.  He feels like the relationship has been fine.  He does note that his girlfriend's son had to move back out [place] and she has been dealing with those issues, and he has been giving her space.  She also became sick with sore throat over the last 2 weeks and ultimately had a peritonsillar abscess that had to be drained.  He was not around her during the illness as they were not sure if it was COVID or something else.  She ended up texting him a few days ago saying that the doctor said what she had was rare and possibly caused by gonorrhea or chlamydia.  She texted him this news and broke up with him by phone and has blocked him from calling at this point.  Thus he has no other details.  His last STD screen was over a year ago.  He denies any prior STD.  He did have unprotected sex with a different partner in January before this relationship.  He has been in his usual state of health otherwise.  He is really upset and down and his mood is he was serious about this relationship, felt like they had a connection, and felt like this was a blind side news placing blame on him for possible STD.   Past Medical History:  Diagnosis Date  . Smoker    Current Outpatient Medications on File Prior to Visit  Medication Sig Dispense Refill  . ALPRAZolam (XANAX) 0.25 MG tablet Take 1 tablet (0.25 mg total) by mouth 2 (two) times daily as needed for anxiety. (Patient not taking: Reported on 04/01/2019) 6 tablet 0  . Aspirin-Acetaminophen-Caffeine (GOODYS EXTRA STRENGTH) 500-325-65 MG PACK Take 1 Package by mouth daily as needed (headache).    . metoprolol succinate (TOPROL-XL) 25 MG 24 hr tablet Take 1 tablet (25 mg total) by mouth daily. (Patient not taking: Reported on 04/01/2019) 90  tablet 3   No current facility-administered medications on file prior to visit.    ROS as in subjective   Objective: BP 112/80   Pulse 73   Temp 97.7 F (36.5 C)   Ht 5\' 8"  (1.727 m)   Wt 188 lb 9.6 oz (85.5 kg)   SpO2 97%   BMI 28.68 kg/m   Gen: wd, wn, nad Normal male, no rash, nontender    Assessment: Encounter Diagnoses  Name Primary?  . Venereal disease contact Yes  . Screen for STD (sexually transmitted disease)   . Relationship problems   . Depressed mood     Plan: We discussed his symptoms and concerns.  He seems to be distraught.  I recommended he seek counseling given this stressful time as well as him doing with his father's lymphoma diagnosis.  I gave a list of counselors.  STD screen today.  I strongly advised to use condoms in the future and get tested more frequently.  We discussed suggested frequency of testing depending on new partners.  I have advised he not make abrupt decisions or rush to judgement as we do not have a lot of details other than the text he got from his girlfriend.  F/u pending labs  Prather was seen today for exposure to std.  Diagnoses and all orders for this visit:  Venereal disease contact -     HIV Antibody (routine testing w rflx) -     RPR -     GC/Chlamydia Probe Amp  Screen for STD (sexually transmitted disease) -     HIV Antibody (routine testing w rflx) -     RPR -     GC/Chlamydia Probe Amp  Relationship problems  Depressed mood

## 2019-04-01 NOTE — Patient Instructions (Signed)
RESOURCES in [place], [place]  If you are experiencing a mental health crisis or an emergency, please call 911 or go to the nearest emergency department.  [facility]   [phone] [facility]  [phone] [facility]   [phone]  Suicide Hotline 1-800-Suicide (1-800-784-2433)  National Suicide Prevention Lifeline 1-800-273-TALK  (1-800-273-8255)  Domestic Violence, Rape/Crisis - [facility] 336-273-7273  The National Domestic Violence Hotline 1-800-799-SAFE (1-800-799-7233)  To report Child or Elder Abuse, please call: [place] Police Department  [phone] [place] Sher[employer]iff Department  [phone]  LGBT Youth Crisis Line 1-866-488-7386  Teen Crisis line 336-387-6161 or 1-877-332-7333     Psychiatry and Counseling services  [facility] [address], [place], [place] [address] [phone]  Holly Ingram, therapist Dr. Carey Cottle, psychiatrist Dr. Glenn Jennings, child psychiatrist   Dr.  Fuller [address], [place], [place] [address] [phone]   Dr. Rupinder Kaur, psychiatry [address], [place], [place] [address] [phone]   [facility] [address], [place], [place] [address] [phone]   [facility] [address], [place], [place] [address] [phone]    Counseling Services (NON- psychiatrist offices)  [facility] [address], [place], [place] [address] [phone]   [facility] [phone] [address], [place], [place] [address]   [facility] [phone]  [email or web address] [address], [place], [place] [address]   Merrianne M. Leff, therapist [phone] [address] [place], [place] [address]   [facility] [phone] [address], [place], [place]  [address]   Jill White-Huffman, therapist [phone] [address], [place], [place] [address]   [facility] [phone] [address], ,  [address]   

## 2019-04-04 LAB — RPR: RPR Ser Ql: REACTIVE — AB

## 2019-04-04 LAB — RPR, QUANT+TP ABS (REFLEX)
Rapid Plasma Reagin, Quant: 1:1 {titer} — ABNORMAL HIGH
T Pallidum Abs: NONREACTIVE

## 2019-04-04 LAB — HIV ANTIBODY (ROUTINE TESTING W REFLEX): HIV Screen 4th Generation wRfx: NONREACTIVE

## 2019-04-05 LAB — GC/CHLAMYDIA PROBE AMP
Chlamydia trachomatis, NAA: NEGATIVE
Neisseria Gonorrhoeae by PCR: NEGATIVE

## 2019-10-11 ENCOUNTER — Encounter: Payer: Self-pay | Admitting: Family Medicine

## 2019-10-11 ENCOUNTER — Ambulatory Visit: Payer: PRIVATE HEALTH INSURANCE | Admitting: Family Medicine

## 2019-10-11 ENCOUNTER — Other Ambulatory Visit: Payer: Self-pay

## 2019-10-11 VITALS — BP 108/72 | HR 85 | Temp 96.8°F | Wt 195.6 lb

## 2019-10-11 DIAGNOSIS — F410 Panic disorder [episodic paroxysmal anxiety] without agoraphobia: Secondary | ICD-10-CM

## 2019-10-11 DIAGNOSIS — M2580 Other specified joint disorders, unspecified joint: Secondary | ICD-10-CM

## 2019-10-11 NOTE — Progress Notes (Signed)
   Subjective:    Patient ID: Jeremiah Bentley, male    DOB: October 17, 1985, 34 y.o.   MRN: [id number]  HPI He states that approximately 3 weeks ago he jumped down off of a 3 foot high barrier and initially felt no discomfort but within 30 minutes had left metatarsal pain.  He has been intermittently taking ibuprofen.  He does state that wearing a boot helps however soft soled shoes make this worse.  No numbness tingling or weakness. He then stated he has had difficulty with panic attacks over the last several weeks.  He describes several instances of being in high places or wide open spaces and having difficulty with that.  He does have a previous history of difficulty with mood issues.  Presently he is on no medications.   Review of Systems     Objective:   Physical Exam Alert and in no distress but tearful when we were talking about his psychological symptoms.  Exam of the left foot shows full motion of the foot and ankle.  Slight tenderness palpation over the sesamoid bones over the first MTP.  Full motion of the MTP joint without pain.       Assessment & Plan:  Sesamoiditis  Panic attacks I explained the diagnosis of sesamoiditis.  Recommend arch supports for his foot.  Also hard soled shoes would be very very helpful.  He will call if further trouble. I then discussed the panic attacks with him explained that the symptoms are very real and counseling can certainly help with that.  He seemed appreciative of this.  I will refer him to [facility] psychology.

## 2019-12-18 IMAGING — CR CHEST - 2 VIEW
2 series · 2 of 2 positions shown · non-contrast
Comparison: None.

CLINICAL DATA: Palpitations

EXAM:
CHEST - 2 VIEW

[w chest pa]
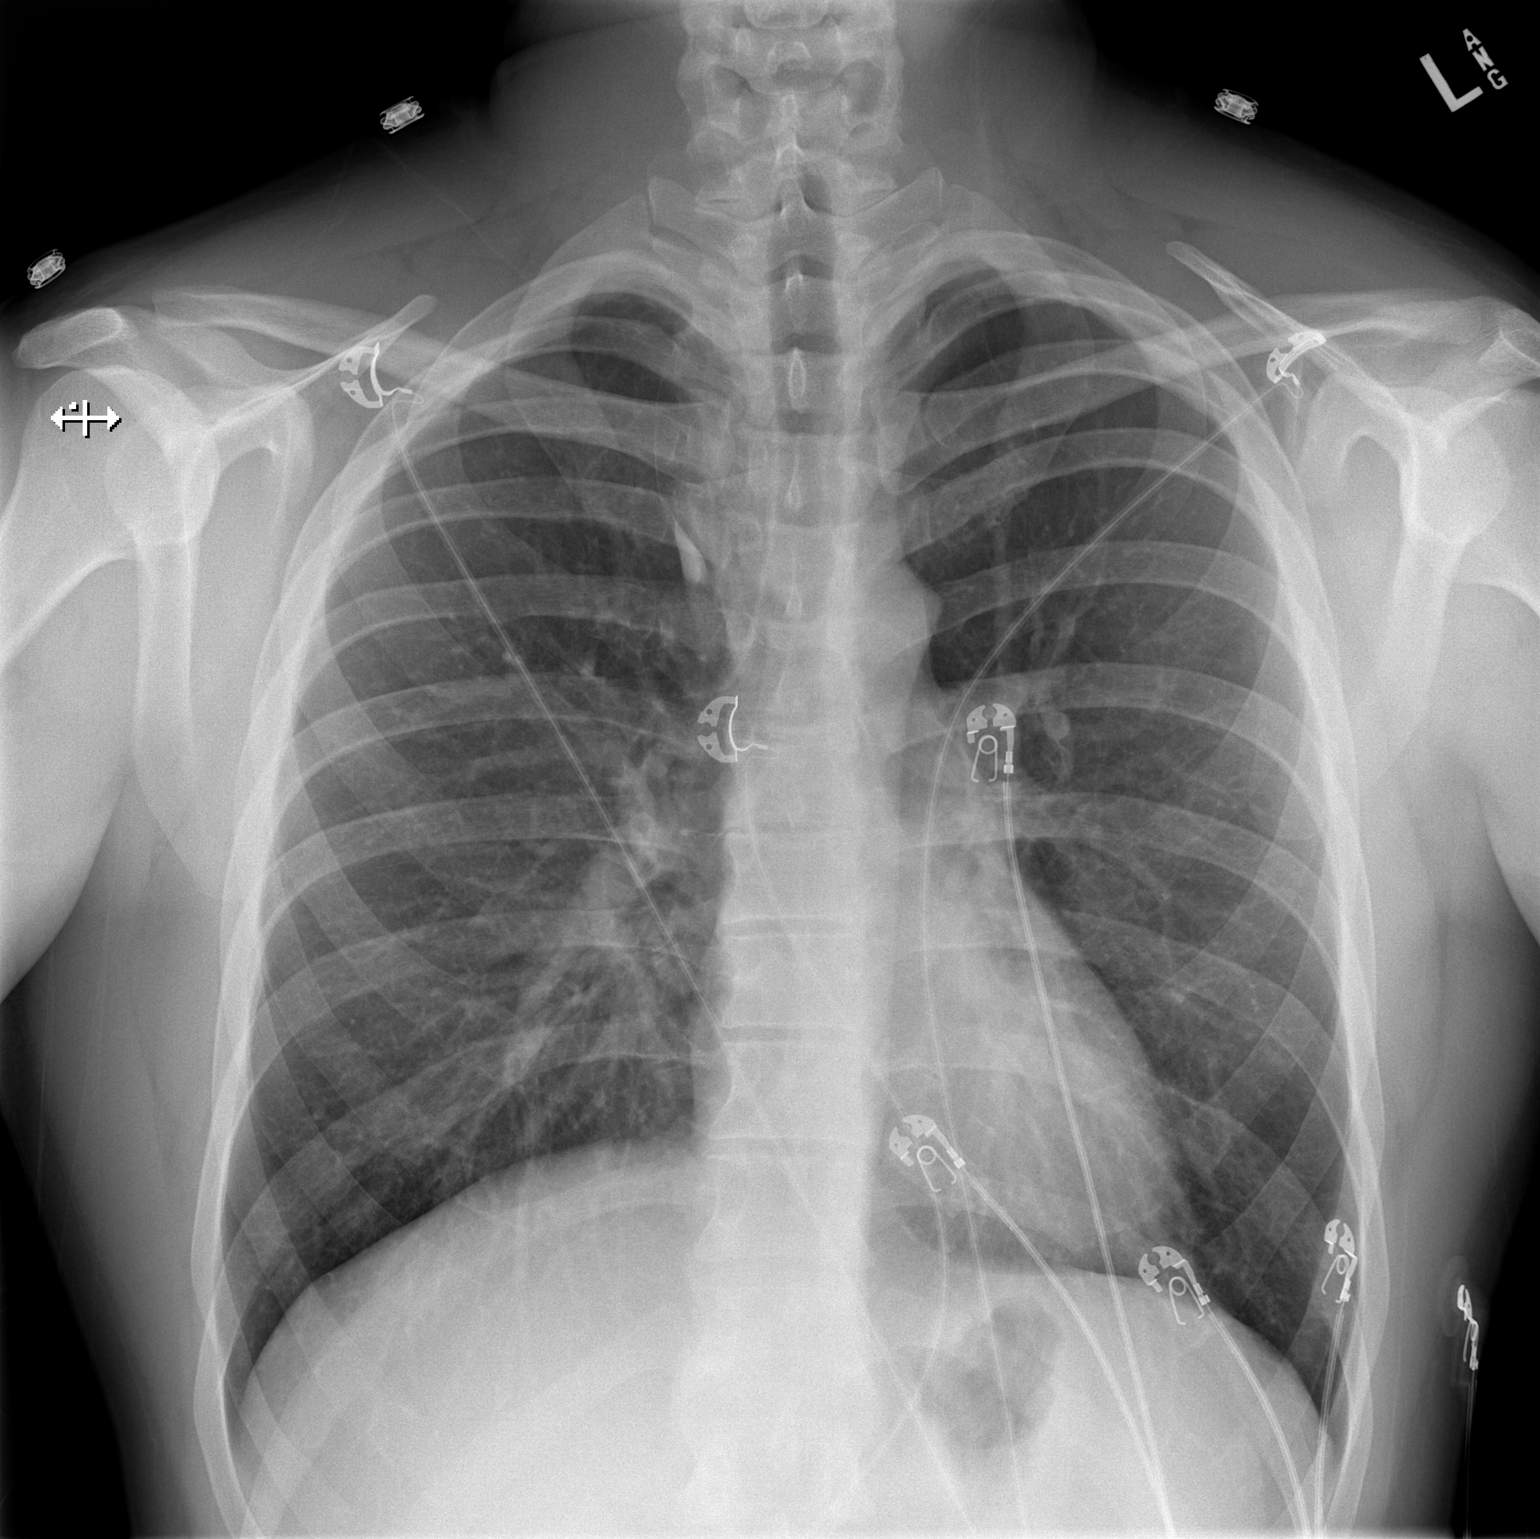

[w chest lat]
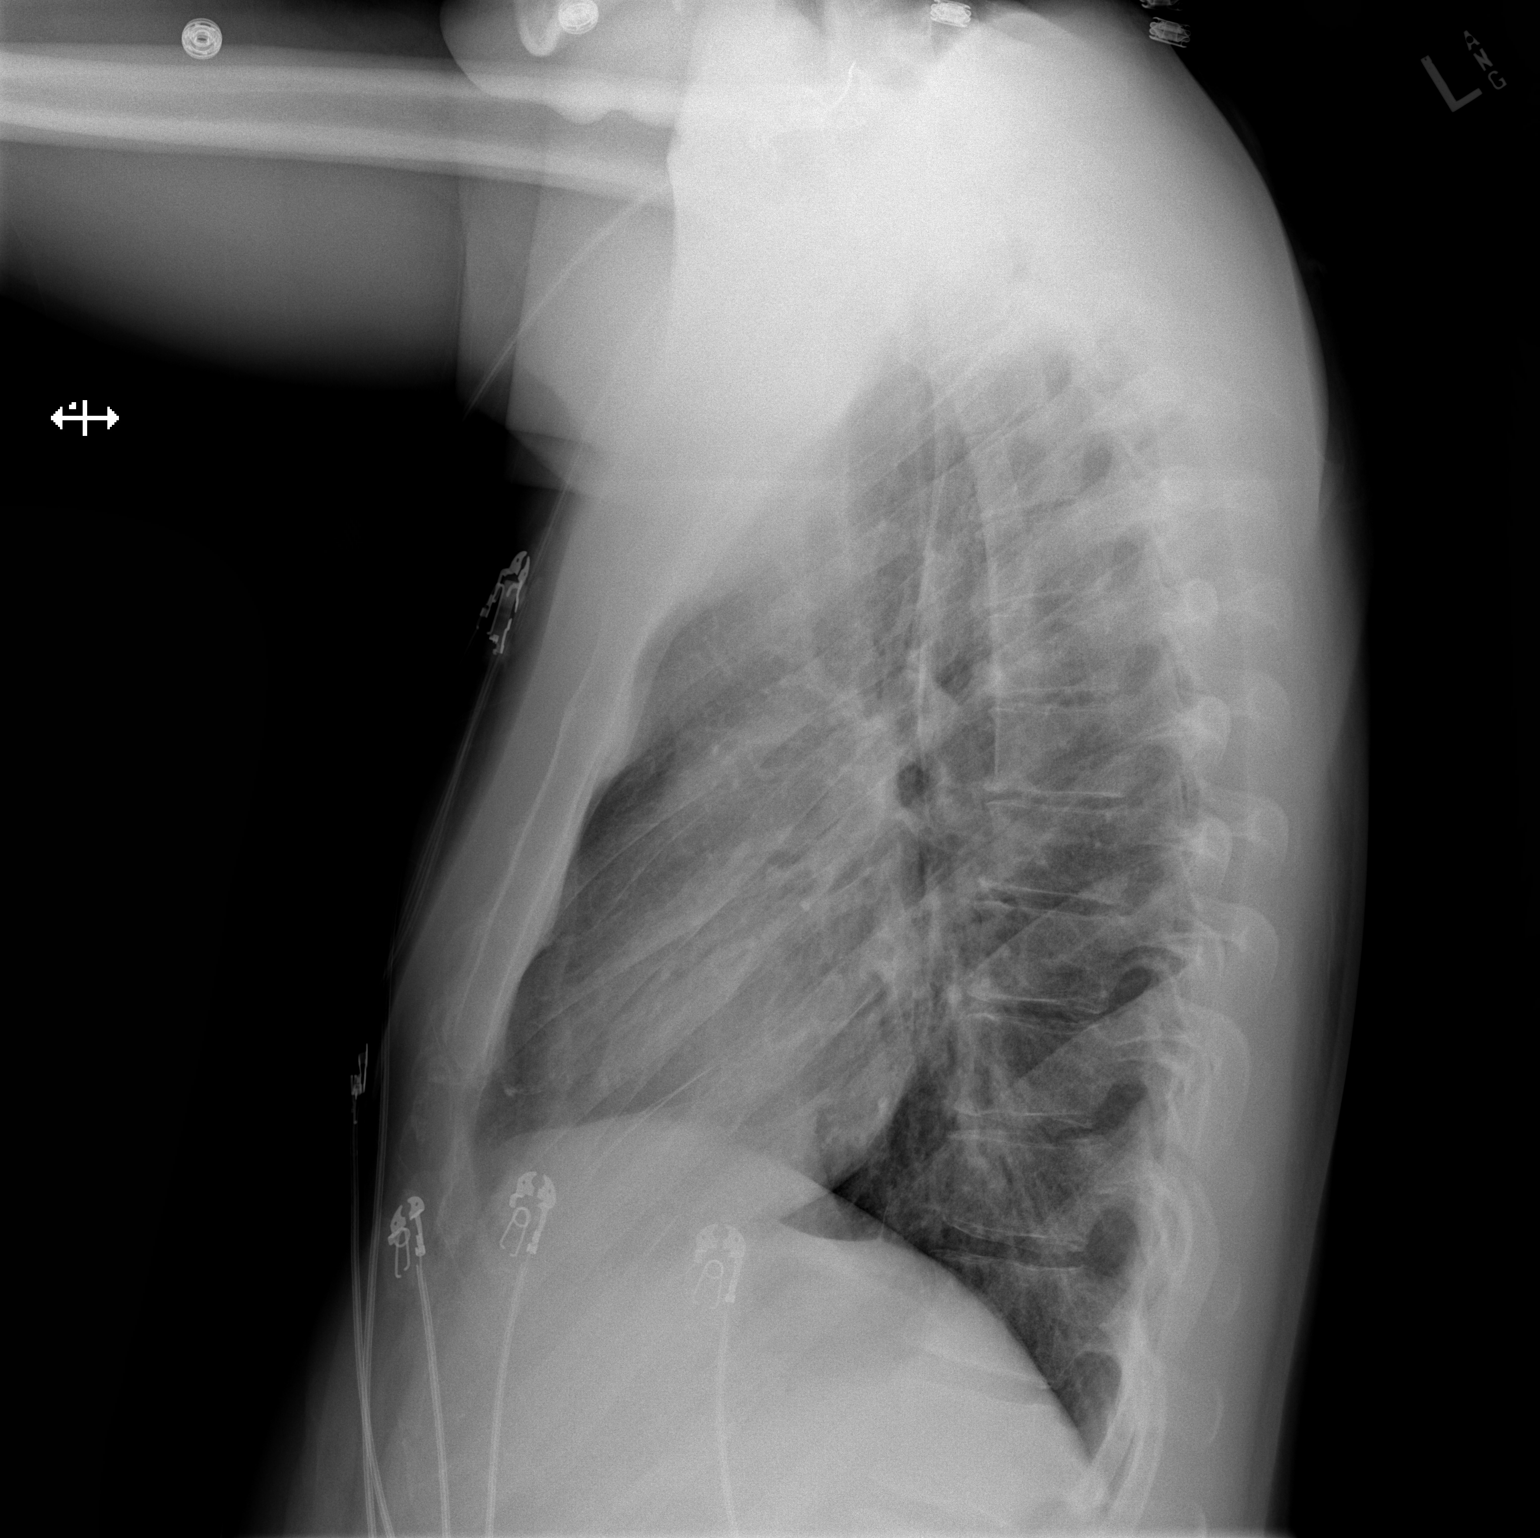

[2 of 2 positions shown; findings below may reference images not displayed]

FINDINGS: Normal heart size. Normal mediastinal contour. No pneumothorax. No
pleural effusion. Lungs appear clear, with no acute consolidative
airspace disease and no pulmonary edema.
IMPRESSION: No active cardiopulmonary disease.

## 2020-01-04 ENCOUNTER — Ambulatory Visit: Payer: PRIVATE HEALTH INSURANCE | Admitting: Family Medicine

## 2020-01-04 ENCOUNTER — Other Ambulatory Visit: Payer: Self-pay

## 2020-01-04 ENCOUNTER — Encounter: Payer: Self-pay | Admitting: Family Medicine

## 2020-01-04 VITALS — BP 128/78 | HR 76 | Temp 97.7°F | Resp 20 | Ht 68.0 in | Wt 184.0 lb

## 2020-01-04 DIAGNOSIS — R0683 Snoring: Secondary | ICD-10-CM

## 2020-01-04 DIAGNOSIS — F410 Panic disorder [episodic paroxysmal anxiety] without agoraphobia: Secondary | ICD-10-CM | POA: Diagnosis not present

## 2020-01-04 DIAGNOSIS — Z23 Encounter for immunization: Secondary | ICD-10-CM | POA: Diagnosis not present

## 2020-01-04 DIAGNOSIS — F17200 Nicotine dependence, unspecified, uncomplicated: Secondary | ICD-10-CM | POA: Diagnosis not present

## 2020-01-04 DIAGNOSIS — Z0000 Encounter for general adult medical examination without abnormal findings: Secondary | ICD-10-CM | POA: Diagnosis not present

## 2020-01-04 MED ORDER — ALPRAZOLAM 0.25 MG PO TABS: 0.2500 mg | ORAL_TABLET | Freq: Two times a day (BID) | ORAL | 0 refills | Status: DC | PRN

## 2020-01-04 NOTE — Progress Notes (Signed)
   Subjective:    Patient ID: Jeremiah Bentley, male    DOB: 09/05/1985, 34 y.o.   MRN: [id number]  HPI He is here for complete examination.  He does state that his girlfriend on several occasions has noted that he stops breathing to the point that she is actually measured between 15 to 30 seconds of apneic episodes.  He also apparently does snore a lot.  He is in the process of losing weight and has lost roughly 15 pounds with the goal of losing at least 10 more.  He does smoke and is at this point not interested in quitting.  He does have previous difficulty with occasional panic attacks.  He has an impending trip via plane and has some concerns over this and also has noted difficulty in open spaces.  Otherwise family and social history as well as health maintenance and immunizations was reviewed.   Review of Systems  All other systems reviewed and are negative.      Objective:   Physical Exam Alert and in no distress. Tympanic membranes and canals are normal. Pharyngeal area is normal. Neck is supple without adenopathy or thyromegaly. Cardiac exam shows a regular sinus rhythm without murmurs or gallops. Lungs are clear to auscultation.  Abdominal exam shows no masses or tenderness.  Renetta Chalk shows normal circumcised male.  Testes normal. Epworth score of 12       Assessment & Plan:  Routine general medical examination at a health care facility - Plan: Comprehensive metabolic panel, CBC with Differential/Platelet, Lipid panel  Panic attacks - Plan: ALPRAZolam (XANAX) 0.25 MG tablet         I discussed the use of Xanax in terms of using this when he is early in the          process of having an attack rather than anticipating that he might have an          attack. Smoker          Recommend that when he is ready to quit, call me and we can further pursue this. Snoring - Plan: Home sleep test  Need for Tdap vaccination - Plan: Tdap vaccine greater than or equal to 7yo IM

## 2020-01-05 LAB — LIPID PANEL
Chol/HDL Ratio: 6.4 ratio — ABNORMAL HIGH (ref 0.0–5.0)
Cholesterol, Total: 216 mg/dL — ABNORMAL HIGH (ref 100–199)
HDL: 34 mg/dL — ABNORMAL LOW (ref >39)
LDL Chol Calc (NIH): 146 mg/dL — ABNORMAL HIGH (ref 0–99)
Triglycerides: 198 mg/dL — ABNORMAL HIGH (ref 0–149)
VLDL Cholesterol Cal: 36 mg/dL (ref 5–40)

## 2020-01-05 LAB — COMPREHENSIVE METABOLIC PANEL
ALT: 20 IU/L (ref 0–44)
AST: 20 IU/L (ref 0–40)
Albumin/Globulin Ratio: 2.5 — ABNORMAL HIGH (ref 1.2–2.2)
Albumin: 4.7 g/dL (ref 4.0–5.0)
Alkaline Phosphatase: 50 IU/L (ref 48–121)
BUN/Creatinine Ratio: 12 (ref 9–20)
BUN: 13 mg/dL (ref 6–20)
Bilirubin Total: 0.2 mg/dL (ref 0.0–1.2)
CO2: 22 mmol/L (ref 20–29)
Calcium: 9.8 mg/dL (ref 8.7–10.2)
Chloride: 106 mmol/L (ref 96–106)
Creatinine, Ser: 1.09 mg/dL (ref 0.76–1.27)
GFR calc Af Amer: 104 mL/min/{1.73_m2} (ref >59)
GFR calc non Af Amer: 90 mL/min/{1.73_m2} (ref >59)
Globulin, Total: 1.9 g/dL (ref 1.5–4.5)
Glucose: 88 mg/dL (ref 65–99)
Potassium: 4.5 mmol/L (ref 3.5–5.2)
Sodium: 141 mmol/L (ref 134–144)
Total Protein: 6.6 g/dL (ref 6.0–8.5)

## 2020-01-05 LAB — CBC WITH DIFFERENTIAL/PLATELET
Basophils Absolute: 0.1 10*3/uL (ref 0.0–0.2)
Basos: 2 %
EOS (ABSOLUTE): 0.4 10*3/uL (ref 0.0–0.4)
Eos: 5 %
Hematocrit: 47.7 % (ref 37.5–51.0)
Hemoglobin: 16.7 g/dL (ref 13.0–17.7)
Immature Grans (Abs): 0 10*3/uL (ref 0.0–0.1)
Immature Granulocytes: 0 %
Lymphocytes Absolute: 3.1 10*3/uL (ref 0.7–3.1)
Lymphs: 38 %
MCH: 31.6 pg (ref 26.6–33.0)
MCHC: 35 g/dL (ref 31.5–35.7)
MCV: 90 fL (ref 79–97)
Monocytes Absolute: 0.6 10*3/uL (ref 0.1–0.9)
Monocytes: 7 %
Neutrophils Absolute: 4 10*3/uL (ref 1.4–7.0)
Neutrophils: 48 %
Platelets: 286 10*3/uL (ref 150–450)
RBC: 5.28 x10E6/uL (ref 4.14–5.80)
RDW: 12 % (ref 11.6–15.4)
WBC: 8.2 10*3/uL (ref 3.4–10.8)

## 2020-01-20 ENCOUNTER — Telehealth: Payer: Self-pay

## 2020-01-20 NOTE — Telephone Encounter (Signed)
Pt called to find out about sleep study and he was advised that the sleep center will call him. KH

## 2020-03-27 ENCOUNTER — Other Ambulatory Visit (INDEPENDENT_AMBULATORY_CARE_PROVIDER_SITE_OTHER): Payer: PRIVATE HEALTH INSURANCE

## 2020-03-27 ENCOUNTER — Other Ambulatory Visit: Payer: Self-pay

## 2020-03-27 ENCOUNTER — Telehealth (INDEPENDENT_AMBULATORY_CARE_PROVIDER_SITE_OTHER): Payer: PRIVATE HEALTH INSURANCE | Admitting: Family Medicine

## 2020-03-27 ENCOUNTER — Encounter: Payer: Self-pay | Admitting: Family Medicine

## 2020-03-27 VITALS — Temp 98.9°F | Wt 184.0 lb

## 2020-03-27 DIAGNOSIS — U071 COVID-19: Secondary | ICD-10-CM | POA: Diagnosis not present

## 2020-03-27 DIAGNOSIS — R509 Fever, unspecified: Secondary | ICD-10-CM

## 2020-03-27 DIAGNOSIS — R05 Cough: Secondary | ICD-10-CM | POA: Diagnosis not present

## 2020-03-27 DIAGNOSIS — R059 Cough, unspecified: Secondary | ICD-10-CM

## 2020-03-27 LAB — POCT INFLUENZA A/B
Influenza A, POC: NEGATIVE
Influenza B, POC: NEGATIVE

## 2020-03-27 LAB — POC COVID19 BINAXNOW: SARS Coronavirus 2 Ag: POSITIVE — AB

## 2020-03-27 NOTE — Progress Notes (Signed)
   Subjective:    Patient ID: Jeremiah Bentley, male    DOB: 1985-09-23, 34 y.o.   MRN: [id number]  HPI I connected with  Jeremiah Bentley on 03/27/20 by a video enabled telemedicine application and verified that I am speaking with the correct person using two identifiers.  Caregility used he is at home and I am in my office I discussed the limitations of evaluation and management by telemedicine. The patient expressed understanding and agreed to proceed. He started yesterday with rhinorrhea, cough and sneezing and later that day developed a temperature of 100.5 as well as fatigue.  He came here earlier today for Covid test which was positive.   Review of Systems     Objective:   Physical Exam Alert and in no distress otherwise not examined      Assessment & Plan:  COVID-19 - Plan: MYCHART COVID-19 HOME MONITORING PROGRAM, Temperature monitoring Discussed the diagnosis and the recommendation to use Tylenol for the fever aches and pains.  Discussed that if he gets a fever, cough, shortness of breath, let me know for further evaluation and possible treatment.  Discussed the use of the antibiotic however at this point he does not qualify based on criteria.  He is to stay sequestered for the next 10 days.  Also recommend any contacts he has had to get tested between 3 to 5 days after exposure.  He expressed understanding of all of this.

## 2020-03-29 ENCOUNTER — Other Ambulatory Visit: Payer: Self-pay

## 2020-03-29 ENCOUNTER — Encounter: Payer: Self-pay | Admitting: Family Medicine

## 2020-03-29 ENCOUNTER — Telehealth: Payer: PRIVATE HEALTH INSURANCE | Admitting: Family Medicine

## 2020-03-29 VITALS — HR 75 | Temp 98.6°F | Ht 68.0 in | Wt 184.0 lb

## 2020-03-29 DIAGNOSIS — U071 COVID-19: Secondary | ICD-10-CM | POA: Diagnosis not present

## 2020-03-29 DIAGNOSIS — R062 Wheezing: Secondary | ICD-10-CM | POA: Diagnosis not present

## 2020-03-29 MED ORDER — ALBUTEROL SULFATE HFA 108 (90 BASE) MCG/ACT IN AERS: 2.0000 | INHALATION_SPRAY | Freq: Four times a day (QID) | RESPIRATORY_TRACT | 0 refills | Status: DC | PRN

## 2020-03-29 NOTE — Patient Instructions (Signed)
Be sure to drink plenty of water to stay well hydrated. Listen to your body and rest--don't try and do too much, if you're having any shortness of breath, dizziness, fatigue, REST.  Continue to take the mucinex that you have at home--it contains expectorant to thin out the mucus, decongestant to dry up the drainage and help with sinus pain, cough suppressant, and tylenol for pain/fever.  Use it as directed (every 4-6 hours, if needed).  We discussed quitting smoking (I know you cut back since you've been sick--don't pick back up, and try and quit!)  We discussed a bland diet, and avoiding dairy, due to the diarrhea. Avoid fried/greasy/spicy foods as well. We discussed BRAT diet--bananas, rice, apple sauce and toast, and a diet that is good for bulking up the stool when having diarrhea.  I do not recommend that your mother come and help take care of you.  You should be kept in isolation, separate from others.  If she does come, you should only stay in your room, and both wear masks if need to be in contact with each other (ie if she brings food to you, etc).  You should be hearing from the folks at the monoclonal antibody infusion clinic directly to set up an appointment for you. This treatment needs to be done within 10 days of symptom onset.   I hope you feel better soon!

## 2020-03-29 NOTE — Progress Notes (Signed)
Start time: 1:31 End time: 2:00 PHONE ONLY   Virtual Visit via telephone Note  I connected with Jeremiah Bentley on 03/29/20 at  1:30 PM EDT by telephone, as he tried multiple times and was unable to connect to video through Caregility.  I verified that I am speaking with the correct person using two identifiers.  Location: Patient: home Provider: office   I discussed the limitations of evaluation and management by telemedicine and the availability of in person appointments. The patient expressed understanding and agreed to proceed.  History of Present Illness: Chief Complaint  Patient presents with  . Shortness of Breath    VIRTUAL SOB and lightheadedness, tested positive for COVID on Tuesday 03/27/20.    2 days ago he was "seen" virtually by Dr. Susann Givens and tested positive for COVID. Symptoms started Monday (3 days ago, just some fatigue the day prior). On Monday he started with T100.5, sneezing, coughing, congestion, runny nose. Cough and congestion persists. Today he noticed that he is getting lightheaded and dizzy when walking through the house or across the yard. He could take deep breaths, but didn't feel like it was helping. Coughing some, no pain with breathing, no shortness of breath or DOE (just the lightheadedness) Pulse ox at home 99%, pulse 76. Nurse neighbor gave him an incentive spirometer to use.  She also listened to his lungs, and reported hearing a wheeze on the right upper lung.  Had a panic attack last night. 911 came out, assessed at the house. Not transported.  He has some techniques (visualization/grounding) that he uses while waiting for the xanax to help.  PMH, PSH, SH reviewed  Outpatient Encounter Medications as of 03/29/2020  Medication Sig Note  . ALPRAZolam (XANAX) 0.25 MG tablet Take 1 tablet (0.25 mg total) by mouth 2 (two) times daily as needed for anxiety.   . Ascorbic Acid (VITAMIN C) 1000 MG tablet Take 1,000 mg by mouth in the morning and at  bedtime.   . cholecalciferol (VITAMIN D3) 25 MCG (1000 UNIT) tablet Take 1,000 Units by mouth daily.   Marland Kitchen ELDERBERRY PO Take 1 each by mouth in the morning and at bedtime. 03/29/2020: gummy  . guaiFENesin (MUCINEX) 600 MG 12 hr tablet Take 600 mg by mouth 2 (two) times daily. 03/29/2020: Taking Mucinex Sinus Max, pressure/pain/cough (acetaminophen, guaifenesin, phenylephrine, dextromethorphan)  . ibuprofen (ADVIL) 200 MG tablet Take 400 mg by mouth every 6 (six) hours as needed.   . zinc gluconate 50 MG tablet Take 50 mg by mouth in the morning and at bedtime.   Marland Kitchen albuterol (VENTOLIN HFA) 108 (90 Base) MCG/ACT inhaler Inhale 2 puffs into the lungs every 6 (six) hours as needed for wheezing or shortness of breath.   . Aspirin-Acetaminophen-Caffeine (GOODYS EXTRA STRENGTH) [phone] MG PACK Take 1 Package by mouth daily as needed (headache). (Patient not taking: Reported on 03/29/2020)   . metoprolol succinate (TOPROL-XL) 25 MG 24 hr tablet Take 1 tablet (25 mg total) by mouth daily. (Patient not taking: Reported on 04/01/2019) 03/29/2020: Never started it   No facility-administered encounter medications on file as of 03/29/2020.   Current OTC medications used for his symptoms: mucinex sinus max, pressure/pain/cough--acetaminophen, guaifenesin, phenylephrine, dextromethorphan.  No Known Allergies  ROS: fever, URI symptoms per HPI. Some diarrhea today No nausea, vomiting,  No loss of smell/taste. No ear pain. Some sinus pain in the mornings (better after a shower). Lightheadedness with exertion, no chest pain, shortness of breath. Cough is not very bothersome.  Observations/Objective: Pulse 75   Temp 98.6 F (37 C) (Oral)   Ht 5\' 8"  (1.727 m)   Wt 184 lb (83.5 kg)   SpO2 99%   BMI 27.98 kg/m    He is alert, oriented.  He is speaking easily, in no distress, rare cough.   Exam is limited due to virtual/telephone nature of the call. He doesn't sound particularly anxious today, just  mild.   Assessment and Plan:  COVID-19 - referred for MAB infusion (meets criteria based on BMI). Supportive managemenet reviewed in detail  Wheezing - Plan: albuterol (VENTOLIN HFA) 108 (90 Base) MCG/ACT inhaler    Follow Up Instructions:    I discussed the assessment and treatment plan with the patient. The patient was provided an opportunity to ask questions and all were answered. The patient agreed with the plan and demonstrated an understanding of the instructions.   The patient was advised to call back or seek an in-person evaluation if the symptoms worsen or if the condition fails to improve as anticipated.  I provided 29 minutes of non-face-to-face time during this encounter. Additional time was spent in chart review, referral, documentation (>35 min total)   , MD

## 2020-04-21 ENCOUNTER — Other Ambulatory Visit: Payer: Self-pay | Admitting: Family Medicine

## 2020-04-21 DIAGNOSIS — R062 Wheezing: Secondary | ICD-10-CM

## 2020-04-24 NOTE — Telephone Encounter (Signed)
Pt does not need this. Will deny

## 2020-07-04 ENCOUNTER — Other Ambulatory Visit: Payer: Self-pay

## 2020-07-04 ENCOUNTER — Ambulatory Visit (INDEPENDENT_AMBULATORY_CARE_PROVIDER_SITE_OTHER): Payer: PRIVATE HEALTH INSURANCE

## 2020-07-04 DIAGNOSIS — Z23 Encounter for immunization: Secondary | ICD-10-CM

## 2020-08-01 ENCOUNTER — Ambulatory Visit (INDEPENDENT_AMBULATORY_CARE_PROVIDER_SITE_OTHER): Payer: PRIVATE HEALTH INSURANCE

## 2020-08-01 ENCOUNTER — Other Ambulatory Visit: Payer: Self-pay

## 2020-08-01 DIAGNOSIS — Z23 Encounter for immunization: Secondary | ICD-10-CM

## 2020-12-25 ENCOUNTER — Other Ambulatory Visit: Payer: Self-pay

## 2020-12-25 ENCOUNTER — Emergency Department (HOSPITAL_BASED_OUTPATIENT_CLINIC_OR_DEPARTMENT_OTHER)
Admission: EM | Admit: 2020-12-25 | Discharge: 2020-12-25 | Disposition: A | Discharge status: Home / Self Care | Payer: 59 | Attending: Emergency Medicine | Admitting: Emergency Medicine

## 2020-12-25 ENCOUNTER — Encounter (HOSPITAL_BASED_OUTPATIENT_CLINIC_OR_DEPARTMENT_OTHER): Payer: Self-pay | Admitting: Emergency Medicine

## 2020-12-25 DIAGNOSIS — R42 Dizziness and giddiness: Secondary | ICD-10-CM | POA: Diagnosis not present

## 2020-12-25 DIAGNOSIS — R110 Nausea: Secondary | ICD-10-CM | POA: Diagnosis not present

## 2020-12-25 DIAGNOSIS — R0602 Shortness of breath: Secondary | ICD-10-CM | POA: Insufficient documentation

## 2020-12-25 DIAGNOSIS — R002 Palpitations: Secondary | ICD-10-CM | POA: Insufficient documentation

## 2020-12-25 DIAGNOSIS — F17210 Nicotine dependence, cigarettes, uncomplicated: Secondary | ICD-10-CM | POA: Diagnosis not present

## 2020-12-25 DIAGNOSIS — R202 Paresthesia of skin: Secondary | ICD-10-CM | POA: Insufficient documentation

## 2020-12-25 DIAGNOSIS — Z7982 Long term (current) use of aspirin: Secondary | ICD-10-CM | POA: Insufficient documentation

## 2020-12-25 LAB — CBC WITH DIFFERENTIAL/PLATELET
Abs Immature Granulocytes: 0.02 10*3/uL (ref 0.00–0.07)
Basophils Absolute: 0.1 10*3/uL (ref 0.0–0.1)
Basophils Relative: 1 %
Eosinophils Absolute: 0.2 10*3/uL (ref 0.0–0.5)
Eosinophils Relative: 2 %
HCT: 46.4 % (ref 39.0–52.0)
Hemoglobin: 16.1 g/dL (ref 13.0–17.0)
Immature Granulocytes: 0 %
Lymphocytes Relative: 27 %
Lymphs Abs: 2.4 10*3/uL (ref 0.7–4.0)
MCH: 30.4 pg (ref 26.0–34.0)
MCHC: 34.7 g/dL (ref 30.0–36.0)
MCV: 87.7 fL (ref 80.0–100.0)
Monocytes Absolute: 0.6 10*3/uL (ref 0.1–1.0)
Monocytes Relative: 7 %
Neutro Abs: 5.4 10*3/uL (ref 1.7–7.7)
Neutrophils Relative %: 63 %
Platelets: 270 10*3/uL (ref 150–400)
RBC: 5.29 MIL/uL (ref 4.22–5.81)
RDW: 12.2 % (ref 11.5–15.5)
WBC: 8.8 10*3/uL (ref 4.0–10.5)
nRBC: 0 % (ref 0.0–0.2)

## 2020-12-25 LAB — BASIC METABOLIC PANEL
Anion gap: 9 (ref 5–15)
BUN: 11 mg/dL (ref 6–20)
CO2: 26 mmol/L (ref 22–32)
Calcium: 9.3 mg/dL (ref 8.9–10.3)
Chloride: 106 mmol/L (ref 98–111)
Creatinine, Ser: 0.97 mg/dL (ref 0.61–1.24)
GFR, Estimated: >60 mL/min (ref >60)
Glucose, Bld: 96 mg/dL (ref 70–99)
Potassium: 3.8 mmol/L (ref 3.5–5.1)
Sodium: 141 mmol/L (ref 135–145)

## 2020-12-25 LAB — MAGNESIUM: Magnesium: 1.9 mg/dL (ref 1.7–2.4)

## 2020-12-25 NOTE — ED Notes (Signed)
Patient verbalizes understanding of discharge instructions. Opportunity for questioning and answers were provided. Armband removed by staff, pt discharged from ED.  

## 2020-12-25 NOTE — ED Provider Notes (Signed)
[facility] EMERGENCY DEPT Provider Note   CSN: [id number] Arrival date & time: 12/25/20  0945     History Chief Complaint  Patient presents with   Dizziness    Jeremiah Bentley is a 35 y.o. male.  35 yo M with a chief complaints of palpitations dizziness and shortness of breath.  This occurred while he was at work today.  Says he felt a little bit of nausea which made him feel like he was maybe hungry and so he went to the vending machine when he got there he started feeling like he was having trouble breathing and felt tingling in his hands and felt like it moved into his chest.  He called 911.  Symptoms lasted for approximately 20 minutes.  Has had a problem like this in the past but has been some years ago.  Not having any symptoms now except for being mildly tired.  The history is provided by the patient.  Dizziness Associated symptoms: palpitations   Associated symptoms: no chest pain, no diarrhea, no headaches, no shortness of breath and no vomiting   Illness Severity:  Moderate Onset quality:  Gradual Duration:  20 minutes Timing:  Rare Progression:  Resolved Chronicity:  Recurrent Associated symptoms: no abdominal pain, no chest pain, no congestion, no diarrhea, no fever, no headaches, no myalgias, no rash, no shortness of breath and no vomiting       Past Medical History:  Diagnosis Date   Smoker     There are no problems to display for this patient.   History reviewed. No pertinent surgical history.     Family History  Problem Relation Age of Onset   Hypertension Father     Social History   Tobacco Use   Smoking status: Every Day    Pack years: 0.00    Types: E-cigarettes, Cigarettes   Smokeless tobacco: Never   Tobacco comments:    smoked 1PPD, quit, restarted; now smoking 1 pack/week.  using e-cigs  Vaping Use   Vaping Use: Some days  Substance Use Topics   Alcohol use: Yes    Comment: 8 drinks per week.   Drug use: Yes     Types: Marijuana    Home Medications Prior to Admission medications   Medication Sig Start Date End Date Taking? Authorizing Provider  Aspirin-Acetaminophen-Caffeine (GOODYS EXTRA STRENGTH) [phone] MG PACK Take 1 Package by mouth daily as needed (headache).   Yes [provider]  ibuprofen (ADVIL) 200 MG tablet Take 400 mg by mouth every 6 (six) hours as needed.   Yes [provider]  albuterol (VENTOLIN HFA) 108 (90 Base) MCG/ACT inhaler Inhale 2 puffs into the lungs every 6 (six) hours as needed for wheezing or shortness of breath. Patient not taking: Reported on 12/25/2020 03/29/20   Joselyn Arrow, MD  ALPRAZolam Prudy Feeler) 0.25 MG tablet Take 1 tablet (0.25 mg total) by mouth 2 (two) times daily as needed for anxiety. Patient not taking: Reported on 12/25/2020 01/04/20   Ronnald Nian, MD  metoprolol succinate (TOPROL-XL) 25 MG 24 hr tablet Take 1 tablet (25 mg total) by mouth daily. Patient not taking: No sig reported 02/15/19   Ronnald Nian, MD    Allergies    Patient has no known allergies.  Review of Systems   Review of Systems  Constitutional:  Negative for chills and fever.  HENT:  Negative for congestion and facial swelling.   Eyes:  Negative for discharge and visual disturbance.  Respiratory:  Negative for shortness of breath.   Cardiovascular:  Positive for palpitations. Negative for chest pain.  Gastrointestinal:  Negative for abdominal pain, diarrhea and vomiting.  Musculoskeletal:  Negative for arthralgias and myalgias.  Skin:  Negative for color change and rash.  Neurological:  Positive for dizziness. Negative for tremors, syncope and headaches.  Psychiatric/Behavioral:  Negative for confusion and dysphoric mood.    Physical Exam Updated Vital Signs BP 119/78   Pulse (!) 56   Temp 98.6 F (37 C) (Oral)   Resp 14   Ht 5\' 8"  (1.727 m)   Wt 83 kg   SpO2 99%   BMI 27.83 kg/m   Physical Exam Vitals and nursing note reviewed.  Constitutional:       Appearance: He is well-developed.  HENT:     Head: Normocephalic and atraumatic.  Eyes:     Pupils: Pupils are equal, round, and reactive to light.  Neck:     Vascular: No JVD.  Cardiovascular:     Rate and Rhythm: Normal rate and regular rhythm.     Heart sounds: No murmur heard.   No friction rub. No gallop.  Pulmonary:     Effort: No respiratory distress.     Breath sounds: No wheezing.  Abdominal:     General: There is no distension.     Tenderness: There is no abdominal tenderness. There is no guarding or rebound.  Musculoskeletal:        General: Normal range of motion.     Cervical back: Normal range of motion and neck supple.  Skin:    Coloration: Skin is not pale.     Findings: No rash.  Neurological:     Mental Status: He is alert and oriented to person, place, and time.  Psychiatric:        Behavior: Behavior normal.    ED Results / Procedures / Treatments   Labs (all labs ordered are listed, but only abnormal results are displayed) Labs Reviewed  CBC WITH DIFFERENTIAL/PLATELET  BASIC METABOLIC PANEL  MAGNESIUM    EKG EKG Interpretation  Date/Time:  Tuesday December 25 2020 09:51:25 EDT Ventricular Rate:  66 PR Interval:  141 QRS Duration: 120 QT Interval:  396 QTC Calculation: 415 R Axis:   78 Text Interpretation: Sinus rhythm Nonspecific intraventricular conduction delay No significant change since last tracing Confirmed by 09-26-1995 [id number]) on 12/25/2020 10:19:46 AM  Radiology No results found.  Procedures .1-3 Lead EKG Interpretation  Date/Time: 12/25/2020 11:53 AM Performed by: 12/27/2020, DO Authorized by: Melene Plan, DO     Interpretation: normal     ECG rate:  66   ECG rate assessment: normal     Rhythm: sinus rhythm     Ectopy: none     Conduction: normal     Medications Ordered in ED Medications - No data to display  ED Course  I have reviewed the triage vital signs and the nursing notes.  Pertinent labs & imaging results  that were available during my care of the patient were reviewed by me and considered in my medical decision making (see chart for details).    MDM Rules/Calculators/A&P                          35 yo M with a chief complaints of palpitations.  This occurred spontaneously while he was at work today.  Lasted for about 20 minutes and now is completely resolved.  EKG with  normal sinus rhythm.  He does have an RSR pattern, not obviously [place] could be type III.  Sounds like the patient has had symptoms like this previously but has been a few years since last occurred.  Will refer to cardiology.  Basic blood work obtained here to assess for electrolyte abnormality.  Potassium magnesium are unremarkable.  No significant anemia.  No issue on his cardiac monitor since he has been here.  We will have him follow-up with his family doctor.  11:54 AM:  I have discussed the diagnosis/risks/treatment options with the patient and believe the pt to be eligible for discharge home to follow-up with PCP, cards. We also discussed returning to the ED immediately if new or worsening sx occur. We discussed the sx which are most concerning (e.g., sudden worsening pain, fever, inability to tolerate by mouth) that necessitate immediate return. Medications administered to the patient during their visit and any new prescriptions provided to the patient are listed below.  Medications given during this visit Medications - No data to display   The patient appears reasonably screen and/or stabilized for discharge and I doubt any other medical condition or other [facility] requiring further screening, evaluation, or treatment in the ED at this time prior to discharge.   Final Clinical Impression(s) / ED Diagnoses Final diagnoses:  Palpitations    Rx / DC Orders ED Discharge Orders     None        Melene Plan, DO 12/25/20 1154

## 2020-12-25 NOTE — Discharge Instructions (Addendum)
We did not find anything abnormal in your blood work.  Please follow-up with your family doctor in the office.  Your EKG is unchanged from baseline, I have given you information for the cardiologist to follow-up with as this is happened to multiple times.

## 2020-12-25 NOTE — ED Triage Notes (Signed)
Pt arrives to ED from work with c/o of dizziness, nausea, tingling throughout the body and chest tightness. Pt reports these symptoms felt like an anxiety or panic attack. Reports these symptoms lasted 30 minutes.No visual disturbances, vomiting, or headache. Pt reports no food this morning and a goody's powder.

## 2021-01-04 ENCOUNTER — Encounter: Payer: PRIVATE HEALTH INSURANCE | Admitting: Family Medicine

## 2021-01-08 ENCOUNTER — Ambulatory Visit: Payer: PRIVATE HEALTH INSURANCE | Admitting: Family Medicine

## 2021-01-08 ENCOUNTER — Encounter: Payer: Self-pay | Admitting: Family Medicine

## 2021-01-08 ENCOUNTER — Other Ambulatory Visit: Payer: Self-pay

## 2021-01-08 VITALS — BP 110/70 | HR 73 | Temp 97.3°F | Ht 68.0 in | Wt 186.4 lb

## 2021-01-08 DIAGNOSIS — Z0000 Encounter for general adult medical examination without abnormal findings: Secondary | ICD-10-CM | POA: Diagnosis not present

## 2021-01-08 DIAGNOSIS — F410 Panic disorder [episodic paroxysmal anxiety] without agoraphobia: Secondary | ICD-10-CM | POA: Diagnosis not present

## 2021-01-08 DIAGNOSIS — Z8616 Personal history of COVID-19: Secondary | ICD-10-CM

## 2021-01-08 NOTE — Progress Notes (Signed)
   Subjective:    Patient ID: Jeremiah Bentley, male    DOB: February 01, 1986, 35 y.o.   MRN: [id number]  HPI He is here for complete examination.  He did have 1 ER visit recently for palpitations that he now realizes it is a stress related issue.  He has a 20 irons in the fire which affected him.  He has had difficulty with this in the past causing panic attacks however he rarely takes Xanax.  His last Xanax was probably a year ago.  He is vaping.  He drinks but socially and intermittently.  Not presently sexually active.  Does have a previous history of COVID.  He has had 2 immunizations. Family and social history as well as health maintenance and immunizations was reviewed  Review of Systems  All other systems reviewed and are negative.     Objective:   Physical Exam Alert and in no distress. Tympanic membranes and canals are normal. Pharyngeal area is normal. Neck is supple without adenopathy or thyromegaly. Cardiac exam shows a regular sinus rhythm without murmurs or gallops. Lungs are clear to auscultation.  Abdominal exam shows no masses or tenderness.  Genitalia normal circumcised male.        Assessment & Plan:  Routine general medical examination at a health care facility  History of COVID-19  Panic attacks I think this panic so attacks are more stress related and he does understand that in I think this is a very minimal problem and should not interfere with potentially joining the [employer].  Did discuss stress and stress management with him.  Explained the idea behind more balance and is like to not let anything put him to the point of stress causing the symptoms that he is having.

## 2021-08-08 ENCOUNTER — Other Ambulatory Visit: Payer: Self-pay

## 2021-08-08 ENCOUNTER — Encounter: Payer: Self-pay | Admitting: Family Medicine

## 2021-08-08 ENCOUNTER — Ambulatory Visit (INDEPENDENT_AMBULATORY_CARE_PROVIDER_SITE_OTHER): Payer: 59 | Admitting: Family Medicine

## 2021-08-08 VITALS — BP 120/80 | HR 72 | Ht 68.0 in | Wt 201.4 lb

## 2021-08-08 DIAGNOSIS — M542 Cervicalgia: Secondary | ICD-10-CM

## 2021-08-08 DIAGNOSIS — Z720 Tobacco use: Secondary | ICD-10-CM | POA: Diagnosis not present

## 2021-08-08 DIAGNOSIS — K219 Gastro-esophageal reflux disease without esophagitis: Secondary | ICD-10-CM | POA: Diagnosis not present

## 2021-08-08 DIAGNOSIS — M62838 Other muscle spasm: Secondary | ICD-10-CM | POA: Diagnosis not present

## 2021-08-08 MED ORDER — MELOXICAM 15 MG PO TABS: 15.0000 mg | ORAL_TABLET | Freq: Every day | ORAL | 0 refills | Status: DC

## 2021-08-08 MED ORDER — CYCLOBENZAPRINE HCL 10 MG PO TABS: 5.0000 mg | ORAL_TABLET | Freq: Three times a day (TID) | ORAL | 0 refills | Status: DC | PRN

## 2021-08-08 NOTE — Progress Notes (Signed)
Chief Complaint  Patient presents with   Neck Pain    Woke up with pain neck about 2 days ago that goes down through his shoulder blade. Dull pain. Has tried heating pad and tylenol back without any relief. When he starts leaning back it hurts or looking down. Sharp pain to get up off floor. Does heavy lifting at work.     Tuesday night, while sleeping, he woke up with neck pain, sharp pain on the left side of the neck down to the shoulder blade.  It hurts to look to the left and looking down. This morning he could barely move his neck, improving.  No radiation of pain into the LUE or any numbness, tingling or weakness.  He tried sleeping with heating pad, didn't help much, still had a lot of pain any time he moved to roll over, very sharp pain. He took 200mg  ibuprofen along with Tylenol (back/muscle tylenol) once yesterday and once this morning. Not much benefit.  Lifting at work (lifting today did not trigger his neck pain, while moving bumper, etc; pain triggered when getting up from ground).   PMH, PSH, SH reviewed  Former smoker, now using snuff.  Outpatient Encounter Medications as of 08/08/2021  Medication Sig Note   acetaminophen (TYLENOL) 500 MG tablet Take 1,000 mg by mouth every 6 (six) hours as needed. 08/08/2021: Took yesterday w/one ibuprofen 200mg    ALPRAZolam (XANAX) 0.25 MG tablet Take 1 tablet (0.25 mg total) by mouth 2 (two) times daily as needed for anxiety. (Patient not taking: Reported on 12/25/2020) 08/08/2021: As needed   ibuprofen (ADVIL) 200 MG tablet Take 400 mg by mouth every 6 (six) hours as needed. (Patient not taking: Reported on 08/08/2021) 08/08/2021: Took yesterday   [DISCONTINUED] albuterol (VENTOLIN HFA) 108 (90 Base) MCG/ACT inhaler Inhale 2 puffs into the lungs every 6 (six) hours as needed for wheezing or shortness of breath. (Patient not taking: Reported on 12/25/2020)    [DISCONTINUED] Aspirin-Acetaminophen-Caffeine (GOODYS EXTRA STRENGTH) 500-325-65 MG PACK  Take 1 Package by mouth daily as needed (headache).    [DISCONTINUED] metoprolol succinate (TOPROL-XL) 25 MG 24 hr tablet Take 1 tablet (25 mg total) by mouth daily. (Patient not taking: Reported on 04/01/2019) 03/29/2020: Never started it   No facility-administered encounter medications on file as of 08/08/2021.   No Known Allergies  ROS: no fever, chills, HA, URI symptoms, numbness, tingling, weakness, chest pain, shortness of breath. No n/v/d. +Frequent heartburn.   PHYSICAL EXAM:  BP 120/80    Pulse 72    Ht 5\' 8"  (1.727 m)    Wt 201 lb 6.4 oz (91.4 kg)    BMI 30.62 kg/m   Pleasant male, appearing to hold his head/neck somewhat stiffly. HEENT: conjunctiva and sclera are clear, EOMI, wearing mask Neck: no spinal tenderness, no lymphadenopathy or mass. Decreased forward flexion. No spasm in paraspinous muscles in neck, or SCM. Has some discomfort in certain portion of trapezius with certain movements, but no palpable knot/spasm. There is a tight, tender area mid scapula on the left (lateral to rhomboid, overlying scapula). Heart: regular rate and rhythm Lungs: clear Abdomen: soft, nontender Extremities: no edema Neuro: alert and oriented, normal strength, gait Psych: normal mood, affect, hygiene and grooming   ASSESSMENT/PLAN:  Muscle spasm - at scapular, tightness in neck.  Stretches, massage, heat, NSAIDs, muscle relaxants. PT vs chiro if not improving (pt asked about, knows Dr. Hardin Negus) - Plan: cyclobenzaprine (FLEXERIL) 10 MG tablet  Neck pain - shown stretches, massage,  NSAIDs and muscle relaxants prn.  Risks/SE reviewed. Discussed PT vs chiro if not improving - Plan: meloxicam (MOBIC) 15 MG tablet  Gastroesophageal reflux disease without esophagitis - briefly reviewed diet  Snuff user - counseled re: risks and encouraged cessation  Take meloxicam once daily with food, every day until you are better. You may use tylenol along with this, if needed. Do not use any  ibuprofen/motrin/advil/aleve/naproxen/Goody/BC along with this prescription.  Use the muscle relaxant at bedtime.  If you don't find it sedating at all, and if having a lot of spasm during the day, you can try taking 1/2-1 tablet during the day, if needed. Use caution, as it can be sedating, don't drive or operate heavy machinery until you know that it isn't affecting you at all. If you need a daytime muscle relaxant that isn't as sedating, contact us (robaxin).  Use moist heat at least 3 times/day. Do range of motion exercise at least twice daily. Massage and stretches are helpful. (Neck stretches as shown--chin to chest, ear to shoulder, looking over the shoulder; upper back stretches--hugging yourself, pulling your shoulders forward and leaning forward).

## 2021-08-08 NOTE — Patient Instructions (Addendum)
°  Take meloxicam once daily with food, every day until you are better. You may use tylenol along with this, if needed. Do not use any ibuprofen/motrin/advil/aleve/naproxen/Goody/BC along with this prescription.  Use the muscle relaxant at bedtime.  If you don't find it sedating at all, and if having a lot of spasm during the day, you can try taking 1/2-1 tablet during the day, if needed. Use caution, as it can be sedating, don't drive or operate heavy machinery until you know that it isn't affecting you at all. If you need a daytime muscle relaxant that isn't as sedating, contact us (robaxin).  Use moist heat at least 3 times/day. Do range of motion exercise at least twice daily. Massage and stretches are helpful. (Neck stretches as shown--chin to chest, ear to shoulder, looking over the shoulder; upper back stretches--hugging yourself, pulling your shoulders forward and leaning forward).  If you aren't improving, next step is physical therapy.  Let us know if you would like a referral (and if you have a preferred location). You had inquired about chiropractic treatment.  I do think that ART at [facility] could be as beneficial as PT.

## 2021-12-26 ENCOUNTER — Ambulatory Visit: Payer: 59 | Admitting: Family Medicine

## 2021-12-26 ENCOUNTER — Encounter: Payer: Self-pay | Admitting: Family Medicine

## 2021-12-26 VITALS — BP 110/60 | HR 76 | Temp 97.7°F | Wt 202.0 lb

## 2021-12-26 DIAGNOSIS — F410 Panic disorder [episodic paroxysmal anxiety] without agoraphobia: Secondary | ICD-10-CM

## 2021-12-26 MED ORDER — CITALOPRAM HYDROBROMIDE 20 MG PO TABS: 20.0000 mg | ORAL_TABLET | Freq: Every day | ORAL | 1 refills | Status: DC

## 2021-12-26 MED ORDER — ALPRAZOLAM 0.25 MG PO TABS: 0.2500 mg | ORAL_TABLET | Freq: Two times a day (BID) | ORAL | 0 refills | Status: DC | PRN

## 2021-12-26 NOTE — Progress Notes (Signed)
   Subjective:    Patient ID: Jeremiah Bentley, male    DOB: 1985-08-01, 36 y.o.   MRN: [id number]  HPI He is here for consult concerning difficulty with anxiety and panic attacks.  He has had difficulty with this for quite some time and has been able to handle it fairly well on his own however has been getting worse.  He is having panic attacks roughly every other day.  He also was involved in a court case concerning paternity.  He is actually trying to prove paternity.  When he has a panic attack he gets chest tightness.  He also notes difficulty with sleep and as well as feeling overwhelmed.  He is interested in getting further care for this.   Review of Systems     Objective:   Physical Exam Alert and in no distress.  He did become tearful discussing his present situation.       Assessment & Plan:  Panic attacks - Plan: citalopram (CELEXA) 20 MG tablet, ALPRAZolam (XANAX) 0.25 MG tablet I discussed various options with him concerning how to treat this.  I will start him out on Celexa and described the use of Xanax on an as-needed basis when he feels another panic attack coming on.  He was also given the phone number for [facility].  He has a scheduled appointment back here in about 3 weeks we will follow-up at that point.  He was comfortable with that.

## 2021-12-27 ENCOUNTER — Telehealth: Payer: Self-pay

## 2022-01-13 ENCOUNTER — Ambulatory Visit (INDEPENDENT_AMBULATORY_CARE_PROVIDER_SITE_OTHER): Payer: 59 | Admitting: Family Medicine

## 2022-01-13 ENCOUNTER — Encounter: Payer: Self-pay | Admitting: Family Medicine

## 2022-01-13 VITALS — BP 108/60 | HR 70 | Temp 96.8°F | Wt 196.8 lb

## 2022-01-13 DIAGNOSIS — Z0000 Encounter for general adult medical examination without abnormal findings: Secondary | ICD-10-CM

## 2022-01-13 DIAGNOSIS — K219 Gastro-esophageal reflux disease without esophagitis: Secondary | ICD-10-CM | POA: Insufficient documentation

## 2022-01-13 DIAGNOSIS — F410 Panic disorder [episodic paroxysmal anxiety] without agoraphobia: Secondary | ICD-10-CM | POA: Insufficient documentation

## 2022-01-13 MED ORDER — SERTRALINE HCL 25 MG PO TABS: 25.0000 mg | ORAL_TABLET | Freq: Every day | ORAL | 3 refills | Status: DC

## 2022-01-13 NOTE — Progress Notes (Signed)
Complete physical exam  Patient: Jeremiah Bentley   DOB: [birth year]   36 y.o. Male  MRN: [id number]  Subjective:    Chief Complaint  Patient presents with   Annual Exam    Fasting     Jeremiah Bentley is a 36 y.o. male who presents today for a complete physical exam. He reports consuming a general diet. Home exercise routine includes calisthenics. He generally feels fairly well. He reports sleeping poorly. He was given Celexa but noted approximate 1 hour later, increased heart rate, dry mouth, weakness.  This lasted through the day and into the next day.  He has not taken any more of these since then.  He does have an appointment August 1 for counseling.  He otherwise seems to be fairly stable psychologically.  He does not use the Xanax very often.  He does have a history of reflux disease but is having very little difficulty with that at the present time.  Otherwise his family and social history as well as health maintenance and immunizations was reviewed.   Most recent fall risk assessment:    05/02/2015    3:11 PM  Fall Risk   Falls in the past year? No     Most recent depression screenings:    01/13/2022    3:03 PM 01/08/2021    2:06 PM  PHQ 2/9 Scores  PHQ - 2 Score 0 0      Patient Active Problem List   Diagnosis Date Noted   History of COVID-19 01/08/2021   Past Medical History:  Diagnosis Date   Smoker    History reviewed. No pertinent surgical history. Social History   Tobacco Use   Smoking status: Former    Types: Cigarettes, E-cigarettes   Smokeless tobacco: Former    Types: Snuff   Tobacco comments:    Not daily  Vaping Use   Vaping Use: Every day   Substances: Nicotine  Substance Use Topics   Alcohol use: Yes    Comment: 8 drinks per week.   Drug use: Not Currently   Family History  Problem Relation Age of Onset   Hypertension Father    No Known Allergies    Patient Care Team: Ronnald Nian, MD as PCP - General (Family Medicine)    Outpatient Medications Prior to Visit  Medication Sig Note   acetaminophen (TYLENOL) 500 MG tablet Take 1,000 mg by mouth every 6 (six) hours as needed. 01/13/2022: Prn last dose was last week   ALPRAZolam (XANAX) 0.25 MG tablet Take 1 tablet (0.25 mg total) by mouth 2 (two) times daily as needed for anxiety. 01/13/2022: Prn last dose was 28th    cyclobenzaprine (FLEXERIL) 10 MG tablet Take 0.5-1 tablets (5-10 mg total) by mouth 3 (three) times daily as needed for muscle spasms. (Patient not taking: Reported on 12/26/2021)    [DISCONTINUED] citalopram (CELEXA) 20 MG tablet Take 1 tablet (20 mg total) by mouth daily. (Patient not taking: Reported on 01/13/2022)    No facility-administered medications prior to visit.    Review of Systems  All other systems reviewed and are negative.         Objective:     BP 108/60   Pulse 70   Temp (!) 96.8 F (36 C)   Wt 196 lb 12.8 oz (89.3 kg)   SpO2 98%   BMI 29.92 kg/m  BP Readings from Last 3 Encounters:  01/13/22 108/60  12/26/21 110/60  08/08/21 120/80   Wt  Readings from Last 3 Encounters:  01/13/22 196 lb 12.8 oz (89.3 kg)  12/26/21 202 lb (91.6 kg)  08/08/21 201 lb 6.4 oz (91.4 kg)      Physical Exam  Alert and in no distress. Tympanic membranes and canals are normal. Pharyngeal area is normal. Neck is supple without adenopathy or thyromegaly. Cardiac exam shows a regular sinus rhythm without murmurs or gallops. Lungs are clear to auscultation.  Abdominal exam shows normal bowel sounds without masses or tenderness.  Normal circumcised male.  Testes normal.  Last CBC Lab Results  Component Value Date   WBC 8.8 12/25/2020   HGB 16.1 12/25/2020   HCT 46.4 12/25/2020   MCV 87.7 12/25/2020   MCH 30.4 12/25/2020   RDW 12.2 12/25/2020   PLT 270 12/25/2020     Last metabolic panel Lab Results  Component Value Date   GLUCOSE 96 12/25/2020   NA 141 12/25/2020   K 3.8 12/25/2020   CL 106 12/25/2020   CO2 26 12/25/2020    BUN 11 12/25/2020   CREATININE 0.97 12/25/2020   GFRNONAA >60 12/25/2020   CALCIUM 9.3 12/25/2020   PROT 6.6 01/04/2020   ALBUMIN 4.7 01/04/2020   LABGLOB 1.9 01/04/2020   AGRATIO 2.5 (H) 01/04/2020   BILITOT 0.2 01/04/2020   ALKPHOS 50 01/04/2020   AST 20 01/04/2020   ALT 20 01/04/2020   ANIONGAP 9 12/25/2020   Last lipids Lab Results  Component Value Date   CHOL 216 (H) 01/04/2020   HDL 34 (L) 01/04/2020   LDLCALC 146 (H) 01/04/2020   TRIG 198 (H) 01/04/2020   CHOLHDL 6.4 (H) 01/04/2020        Assessment & Plan:    Routine general medical examination at a health care facility - Plan: CBC with Differential/Platelet, Comprehensive metabolic panel, Lipid panel  Panic attacks - Plan: sertraline (ZOLOFT) 25 MG tablet  Gastroesophageal reflux disease without esophagitis  Immunization History  Administered Date(s) Administered   Influenza Split 03/05/2010   PFIZER(Purple Top)SARS-COV-2 Vaccination 07/04/2020, 08/01/2020   Tdap 01/04/2020    Health Maintenance  Topic Date Due   COVID-19 Vaccine (3 - Pfizer series) 01/30/2023 (Originally 09/26/2020)   INFLUENZA VACCINE  02/04/2022   TETANUS/TDAP  01/03/2030   Hepatitis C Screening  Completed   HIV Screening  Completed   HPV VACCINES  Aged Out  Discussed the use of the new medication.  I will start him on a very low-dose of sertraline.  He is to call me within the next week or 2 to let me how he is doing.  I will slowly increase this medication.  Encouraged him to keep the appointment with the therapist.   Problem List Items Addressed This Visit   None Visit Diagnoses     Routine general medical examination at a health care facility    -  Primary   Relevant Orders   CBC with Differential/Platelet   Comprehensive metabolic panel   Lipid panel   Panic attacks       Relevant Medications   sertraline (ZOLOFT) 25 MG tablet   Gastroesophageal reflux disease without esophagitis          Return in about 1 year  (around 01/14/2023) for fasting cpe .     Sharlot Gowda, MD

## 2022-01-13 NOTE — Patient Instructions (Addendum)
On the body looking for after visit summary also get ready to put it here loops health Maintenance, Male Adopting a healthy lifestyle and getting preventive care are important in promoting health and wellness. Ask your health care provider about: The right schedule for you to have regular tests and exams. Things you can do on your own to prevent diseases and keep yourself healthy. What should I know about diet, weight, and exercise? Eat a healthy diet  Eat a diet that includes plenty of vegetables, fruits, low-fat dairy products, and lean protein. Do not eat a lot of foods that are high in solid fats, added sugars, or sodium. Maintain a healthy weight Body mass index (BMI) is a measurement that can be used to identify possible weight problems. It estimates body fat based on height and weight. Your health care provider can help determine your BMI and help you achieve or maintain a healthy weight. Get regular exercise Get regular exercise. This is one of the most important things you can do for your health. Most adults should: Exercise for at least 150 minutes each week. The exercise should increase your heart rate and make you sweat (moderate-intensity exercise). Do strengthening exercises at least twice a week. This is in addition to the moderate-intensity exercise. Spend less time sitting. Even light physical activity can be beneficial. Watch cholesterol and blood lipids Have your blood tested for lipids and cholesterol at 34 years of age, then have this test every 5 years. You may need to have your cholesterol levels checked more often if: Your lipid or cholesterol levels are high. You are older than 34 years of age. You are at high risk for heart disease. What should I know about cancer screening? Many types of cancers can be detected early and may often be prevented. Depending on your health history and family history, you may need to have cancer screening at various ages. This may include  screening for: Colorectal cancer. Prostate cancer. Skin cancer. Lung cancer. What should I know about heart disease, diabetes, and high blood pressure? Blood pressure and heart disease High blood pressure causes heart disease and increases the risk of stroke. This is more likely to develop in people who have high blood pressure readings or are overweight. Talk with your health care provider about your target blood pressure readings. Have your blood pressure checked: Every 3-5 years if you are 3-32 years of age. Every year if you are 72 years old or older. If you are between the ages of 64 and 63 and are a current or former smoker, ask your health care provider if you should have a one-time screening for abdominal aortic aneurysm (AAA). Diabetes Have regular diabetes screenings. This checks your fasting blood sugar level. Have the screening done: Once every three years after age 25 if you are at a normal weight and have a low risk for diabetes. More often and at a younger age if you are overweight or have a high risk for diabetes. What should I know about preventing infection? Hepatitis B If you have a higher risk for hepatitis B, you should be screened for this virus. Talk with your health care provider to find out if you are at risk for hepatitis B infection. Hepatitis C Blood testing is recommended for: Everyone born from [birth year] through 1963. Anyone with known risk factors for hepatitis C. Sexually transmitted infections (STIs) You should be screened each year for STIs, including gonorrhea and chlamydia, if: You are sexually active and are  younger than 34 years of age. You are older than 34 years of age and your health care provider tells you that you are at risk for this type of infection. Your sexual activity has changed since you were last screened, and you are at increased risk for chlamydia or gonorrhea. Ask your health care provider if you are at risk. Ask your health care  provider about whether you are at high risk for HIV. Your health care provider may recommend a prescription medicine to help prevent HIV infection. If you choose to take medicine to prevent HIV, you should first get tested for HIV. You should then be tested every 3 months for as long as you are taking the medicine. Follow these instructions at home: Alcohol use Do not drink alcohol if your health care provider tells you not to drink. If you drink alcohol: Limit how much you have to 0-2 drinks a day. Know how much alcohol is in your drink. In the [place] one drink equals one 12 oz bottle of beer (355 mL), one 5 oz glass of wine (148 mL), or one 1 oz glass of hard liquor (44 mL). Lifestyle Do not use any products that contain nicotine or tobacco. These products include cigarettes, chewing tobacco, and vaping devices, such as e-cigarettes. If you need help quitting, ask your health care provider. Do not use street drugs. Do not share needles. Ask your health care provider for help if you need support or information about quitting drugs. General instructions Schedule regular health, dental, and eye exams. Stay current with your vaccines. Tell your health care provider if: You often feel depressed. You have ever been abused or do not feel safe at home. Summary Adopting a healthy lifestyle and getting preventive care are important in promoting health and wellness. Follow your health care provider's instructions about healthy diet, exercising, and getting tested or screened for diseases. Follow your health care provider's instructions on monitoring your cholesterol and blood pressure. This information is not intended to replace advice given to you by your health care provider. Make sure you discuss any questions you have with your health care provider. Call me in 1 week to let me know how you are tolerating the new medicine.  I am going to slowly increase that medication.  I will schedule you back at  a later date after you are on a higher dose of this medicine. Document Revised: 11/12/2020 Document Reviewed: 11/12/2020 Elsevier Patient Education  2023 [employer].

## 2022-01-14 LAB — LIPID PANEL
Chol/HDL Ratio: 5.5 ratio — ABNORMAL HIGH (ref 0.0–5.0)
Cholesterol, Total: 197 mg/dL (ref 100–199)
HDL: 36 mg/dL — ABNORMAL LOW (ref >39)
LDL Chol Calc (NIH): 135 mg/dL — ABNORMAL HIGH (ref 0–99)
Triglycerides: 141 mg/dL (ref 0–149)
VLDL Cholesterol Cal: 26 mg/dL (ref 5–40)

## 2022-01-14 LAB — COMPREHENSIVE METABOLIC PANEL
ALT: 22 IU/L (ref 0–44)
AST: 17 IU/L (ref 0–40)
Albumin/Globulin Ratio: 2.3 — ABNORMAL HIGH (ref 1.2–2.2)
Albumin: 4.9 g/dL (ref 4.1–5.1)
Alkaline Phosphatase: 56 IU/L (ref 44–121)
BUN/Creatinine Ratio: 10 (ref 9–20)
BUN: 11 mg/dL (ref 6–20)
Bilirubin Total: 0.7 mg/dL (ref 0.0–1.2)
CO2: 25 mmol/L (ref 20–29)
Calcium: 9.5 mg/dL (ref 8.7–10.2)
Chloride: 104 mmol/L (ref 96–106)
Creatinine, Ser: 1.13 mg/dL (ref 0.76–1.27)
Globulin, Total: 2.1 g/dL (ref 1.5–4.5)
Glucose: 96 mg/dL (ref 70–99)
Potassium: 4.6 mmol/L (ref 3.5–5.2)
Sodium: 141 mmol/L (ref 134–144)
Total Protein: 7 g/dL (ref 6.0–8.5)
eGFR: 88 mL/min/{1.73_m2} (ref >59)

## 2022-01-14 LAB — CBC WITH DIFFERENTIAL/PLATELET
Basophils Absolute: 0.1 10*3/uL (ref 0.0–0.2)
Basos: 1 %
EOS (ABSOLUTE): 0.3 10*3/uL (ref 0.0–0.4)
Eos: 3 %
Hematocrit: 46 % (ref 37.5–51.0)
Hemoglobin: 15.8 g/dL (ref 13.0–17.7)
Immature Grans (Abs): 0 10*3/uL (ref 0.0–0.1)
Immature Granulocytes: 0 %
Lymphocytes Absolute: 3.5 10*3/uL — ABNORMAL HIGH (ref 0.7–3.1)
Lymphs: 34 %
MCH: 30.5 pg (ref 26.6–33.0)
MCHC: 34.3 g/dL (ref 31.5–35.7)
MCV: 89 fL (ref 79–97)
Monocytes Absolute: 0.6 10*3/uL (ref 0.1–0.9)
Monocytes: 6 %
Neutrophils Absolute: 5.8 10*3/uL (ref 1.4–7.0)
Neutrophils: 56 %
Platelets: 291 10*3/uL (ref 150–450)
RBC: 5.18 x10E6/uL (ref 4.14–5.80)
RDW: 11.8 % (ref 11.6–15.4)
WBC: 10.3 10*3/uL (ref 3.4–10.8)

## 2022-01-21 ENCOUNTER — Encounter: Payer: Self-pay | Admitting: Family Medicine

## 2022-01-21 ENCOUNTER — Ambulatory Visit (INDEPENDENT_AMBULATORY_CARE_PROVIDER_SITE_OTHER): Payer: 59 | Admitting: Family Medicine

## 2022-01-21 VITALS — BP 112/70 | HR 68 | Temp 97.2°F | Wt 200.2 lb

## 2022-01-21 DIAGNOSIS — F410 Panic disorder [episodic paroxysmal anxiety] without agoraphobia: Secondary | ICD-10-CM

## 2022-01-21 MED ORDER — ALPRAZOLAM 0.25 MG PO TABS: 0.2500 mg | ORAL_TABLET | Freq: Two times a day (BID) | ORAL | 0 refills | Status: DC | PRN

## 2022-01-21 NOTE — Progress Notes (Signed)
   Subjective:    Patient ID: Jeremiah Bentley, male    DOB: [birth year], 36 y.o.   MRN: [id number]  HPI He is here as an acute visit due to anxiety.  He started taking 25 mg of Zoloft on Saturday evening.  The next day he noted some nausea and dizziness that usually occurred in the morning.  He did take 1 Xanax Saturday evening and he said it did help with his anxiety.  Today he again had dizziness and nausea as well as increased heart rate and felt he was unable to drive.   Review of Systems     Objective:   Physical Exam Alert and in no distress.  Cardiac exam shows regular rhythm without murmurs or gallops.  Lungs clear to auscultation.  He does not presently appear anxious.       Assessment & Plan:  Panic attacks Difficult to assess whether his symptoms are really medication related or panic but I think they are more panic attack related.  I will have him take his Xanax more regularly( twice a day) to see if this will help quiet things down.  I put him on a low-dose to try and minimize any potential for side effects from the medication.  He is to set up a virtual visit for August 7 to reevaluate this.

## 2022-02-03 ENCOUNTER — Telehealth: Payer: 59 | Admitting: Medical

## 2022-02-03 ENCOUNTER — Encounter: Payer: Self-pay | Admitting: Family Medicine

## 2022-02-03 ENCOUNTER — Telehealth: Payer: 59 | Admitting: Family Medicine

## 2022-02-03 VITALS — Ht 68.0 in | Wt 194.0 lb

## 2022-02-03 DIAGNOSIS — F410 Panic disorder [episodic paroxysmal anxiety] without agoraphobia: Secondary | ICD-10-CM

## 2022-02-03 MED ORDER — SERTRALINE HCL 50 MG PO TABS: 50.0000 mg | ORAL_TABLET | Freq: Every day | ORAL | 1 refills | Status: DC

## 2022-02-03 MED ORDER — ALPRAZOLAM 0.25 MG PO TABS: 0.2500 mg | ORAL_TABLET | Freq: Three times a day (TID) | ORAL | 0 refills | Status: DC | PRN

## 2022-02-03 NOTE — Patient Instructions (Addendum)
Increase the sertraline to 50mg . We are going to do this slowly, given your tolerability of the 25mg  dose so far. Take 1.5 tablets of the 25mg  tablets for 3-4 days, and if doing well, then increase to the full 50mg  (2 pills, or the new prescription that was sent in). You likely will have some recurrent side effects (nausea, dizziness). This should resolve after a few days (up to a week), as it did with the lower dose. You may experience some increased anxiety, temporarily, but a higher dose is needed to ultimately treat the anxiety and prevent panic attacks. Use the alprazolam twice daily for the first 3-4 days after the dose increase. You may take an extra dose (up to 0.5mg  at a time), if needed for anxiety. Know that sedation is a risk with the higher doses and more frequent, so do not drive or operate heavy machinery while on the higher doses.  Follow up as scheduled next week with Dr. . If you find the medication is worsening your sleep, you can try switching it to morning.

## 2022-02-03 NOTE — Progress Notes (Signed)
Start time: 1:53 End time: 2:28  Virtual Visit via Video Note  I connected with Jeremiah Bentley on 02/03/22 by a video enabled telemedicine application and verified that I am speaking with the correct person using two identifiers.  Location: Patient: at his father's house Provider: office   I discussed the limitations of evaluation and management by telemedicine and the availability of in person appointments. The patient expressed understanding and agreed to proceed.  History of Present Illness:  Chief Complaint  Patient presents with   Consult    VIRTUAL just started on zoloft about 2 weeks ago. Having some issues. Having some different thoughts on it and increased anxiety. Took xanax for the first two weeks as instructed by [employer] (One twice daily) Having panic attacks even with taking the xanax. Felt like everything was coming in on him this am-began crying at work.    He started taking 25 mg of Zoloft shortly after his physical with Dr. Susann Givens earlier this month.  At a visit on  7/18 he reported that the day after first dose he noted some nausea and dizziness in the morning. At that visit he reported dizziness and nausea as well as increased heart rate and felt he was unable to drive. He was then told to take xanax BID, regularly for a couple of weeks. He has 2-3 tablets left.  He needed an extra dose on Thursday (the panic attack was 2-3 hours after his evening dose).  Today he reports that he feels like everything has been amplified, worsened anxiety since starting the sertraline.  The dizziness and nausea resolved, he felt pretty good last week, until Thursday night when he had a panic attack. Early Saturday morning, he woke up with intrusive thoughts, had another panic attack.  He has been feeling somewhat down this past weekend.  He notes having more trouble sleeping since taking the sertraline at night.  He has tried taking it earlier, but still having trouble.  Talked to a neighbor,  had a rough time x 3-4 weeks, when starting zoloft also.  Previously tried celexa--had a panic attack within 30 mins of taking it, weak, dizzy, could barely walk. (He took 20mg  tablet)  Has an appt with therapist tomorrow at 3pm )  PMH, PSH, SH reviewed  Outpatient Encounter Medications as of 02/03/2022  Medication Sig Note   sertraline (ZOLOFT) 50 MG tablet Take 1 tablet (50 mg total) by mouth daily.    [DISCONTINUED] ALPRAZolam (XANAX) 0.25 MG tablet Take 1 tablet (0.25 mg total) by mouth 2 (two) times daily as needed for anxiety. 02/03/2022: Took one this am   [DISCONTINUED] sertraline (ZOLOFT) 25 MG tablet Take 1 tablet (25 mg total) by mouth daily. 02/03/2022: Took last night)has been taking at 8pm nightly   acetaminophen (TYLENOL) 500 MG tablet Take 1,000 mg by mouth every 6 (six) hours as needed. (Patient not taking: Reported on 02/03/2022) 02/03/2022: prn   ALPRAZolam (XANAX) 0.25 MG tablet Take 1-2 tablets (0.25-0.5 mg total) by mouth 3 (three) times daily as needed for anxiety.    cyclobenzaprine (FLEXERIL) 10 MG tablet Take 0.5-1 tablets (5-10 mg total) by mouth 3 (three) times daily as needed for muscle spasms. (Patient not taking: Reported on 12/26/2021) 12/26/2021: Last dose feb.   No facility-administered encounter medications on file as of 02/03/2022.   Taking 25mg  of sertraline prior to today's visit, not 50mg   No Known Allergies  ROS:  no fever, chills, URI symptoms.  Nausea and dizziness resolved.  Persistent significant anxiety.  Insomnia. See HPI.    Observations/Objective:  Ht 5\' 8"  (1.727 m)   Wt 194 lb (88 kg)   BMI 29.50 kg/m   Pleasant, well-appearing male. He appears mild-mod anxious.   Normal eye contact, speech, grooming. Cranial nerves grossly intact. Exam is limited due to virtual nature of the visit.   Assessment and Plan:  Panic attacks - and persistent anxiety.  Other SE of sertraline have resolved.  Discussed titrating up the  dose. RF xanax to use BID short-term with dose increase, and prn - Plan: sertraline (ZOLOFT) 50 MG tablet, ALPRAZolam (XANAX) 0.25 MG tablet  Titrate up sertraline to 50mg , covering with xanax short-term. Discussed f/u in 4 weeks--prefers to f/u with Dr. as scheduled on Monday. Advised he can switch sertraline to morning dosing if he feels it may be worsening insomnia. Has therapist appt tomorrow. Taught him "breathe the box" to help with anxiety/panic attacks in the interim. Risks/SE of meds reviewed at length, especially with alprazolam and his job.   Increase the sertraline to 50mg . We are going to do this slowly, given your tolerability of the 25mg  dose so far. Take 1.5 tablets of the 25mg  tablets for 3-4 days, and if doing well, then increase to the full 50mg  (2 pills, or the new prescription that was sent in). You likely will have some recurrent side effects (nausea, dizziness). This should resolve after a few days (up to a week), as it did with the lower dose. You may experience some increased anxiety, temporarily, but a higher dose is needed to ultimately treat the anxiety and prevent panic attacks. Use the alprazolam twice daily for the first 3-4 days after the dose increase. You may take an extra dose (up to 0.5mg  at a time), if needed for anxiety. Know that sedation is a risk with the higher doses and more frequent, so do not drive or operate heavy machinery while on the higher doses.    Follow Up Instructions:    I discussed the assessment and treatment plan with the patient. The patient was provided an opportunity to ask questions and all were answered. The patient agreed with the plan and demonstrated an understanding of the instructions.   The patient was advised to call back or seek an in-person evaluation if the symptoms worsen or if the condition fails to improve as anticipated.  I spent 35 minutes dedicated to the care of this patient, including pre-visit review  of records, face to face time, post-visit ordering of testing and documentation.    Susann Givens, MD

## 2022-02-04 ENCOUNTER — Ambulatory Visit (INDEPENDENT_AMBULATORY_CARE_PROVIDER_SITE_OTHER): Payer: 59 | Admitting: Psychology

## 2022-02-04 DIAGNOSIS — F411 Generalized anxiety disorder: Secondary | ICD-10-CM | POA: Diagnosis not present

## 2022-02-04 NOTE — Progress Notes (Signed)
[facility] Behavioral Health Counselor Initial Adult Exam  Name: Jeremiah Bentley Date: 02/04/2022 MRN: [id number] DOB: 06-27-1986 PCP: Jeremiah Nian, MD  Time spent: 45 mins  Guardian/Payee:  Pt    Paperwork requested: No   Reason for Visit /Presenting Problem: Pt presents for initial session in the office, granting consent for the session.  Pt goes by "Jeremiah Bentley."  Mental Status Exam: Appearance:   Casual     Behavior:  Appropriate  Motor:  Normal  Speech/Language:   Clear and Coherent  Affect:  Appropriate  Mood:  normal  Thought process:  normal  Thought content:    WNL  Sensory/Perceptual disturbances:    WNL  Orientation:  oriented to person, place, and time/date  Attention:  Good  Concentration:  Good  Memory:  WNL  Fund of knowledge:   Good  Insight:    Good  Judgment:   Good  Impulse Control:  Good   Reported Symptoms:  Pt shares that he is here for therapy due to panic attacks; he is recently started Zoloft (50 mg) and Xanax (.25mg  bid) from his PCP.  Pt has a history of panic attacks for the past 4-5 yrs; had to wear a heart monitor to rule out cardiac issues.  Pt shares he had panic attacks frequently 910 in June and 10 in July).  Pt notes that he is not able to recognize any particular pattern with them.     Risk Assessment: Danger to Self:  No Self-injurious Behavior: No Danger to Others: No Duty to Warn:no Physical Aggression / Violence:No  Access to Firearms a concern: No  Gang Involvement:No  Patient / guardian was educated about steps to take if suicide or homicide risk level increases between visits: n/a While future psychiatric events cannot be accurately predicted, the patient does not currently require acute inpatient psychiatric care and does not currently meet [facility] involuntary commitment criteria.  Substance Abuse History: Current substance abuse: No     Past Psychiatric History:   No previous psychological problems have been  observed Outpatient Providers:None History of Psych Hospitalization: No  Psychological Testing:  none    Abuse History:  Victim of: No.,  none    Report needed: No. Victim of Neglect:No. Perpetrator of  none   Witness / Exposure to Domestic Violence: No   Protective Services Involvement: No  Witness to [facility] Violence:  No   Family History:  Family History  Problem Relation Age of Onset   Hypertension Father     Living situation: the patient lives alone; grew up with mom in [place]; went to [facility] [facility] (2008); parents divorced with pt was 49 yo; no siblings; good relationship with parents; owns his own home; hopes to pay it off in a year or two  Sexual Orientation: Straight  Relationship Status: single  Name of spouse / other:Jeremiah Bentley If a parent, number of children / ages: pt slept with a woman in 2017 and she had a child; pt has a 2018 who is trying to help him to be able to see the child and to be involved in his life; mother of the child is not cooperating with the court.  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 Service: No   Educational History: Education:  some college  Religion/Sprituality/World View:  Protestant  Any cultural differences that may affect / interfere with treatment:  not applicable   Recreation/Hobbies: Enjoys riding 4 wheelers, playing guitar, playing pool, working on older cars  Stressors: Armed forces operational officer issue   Marital or family conflict   Other: anxiety    Strengths: Supportive Relationships and Family  Barriers:  None noted  Legal History: Pending legal issue / charges: The patient has been involved with the police as a result of DWI when pt was 36 yo; also pursuing parental clarification for child pt  believes is his. History of legal issue / charges: DWI  Medical History/Surgical History: reviewed Past Medical History:  Diagnosis Date   Smoker     No past surgical history on file.  Medications: Current Outpatient Medications  Medication Sig Dispense Refill   acetaminophen (TYLENOL) 500 MG tablet Take 1,000 mg by mouth every 6 (six) hours as needed. (Patient not taking: Reported on 02/03/2022)     ALPRAZolam (XANAX) 0.25 MG tablet Take 1-2 tablets (0.25-0.5 mg total) by mouth 3 (three) times daily as needed for anxiety. 20 tablet 0   cyclobenzaprine (FLEXERIL) 10 MG tablet Take 0.5-1 tablets (5-10 mg total) by mouth 3 (three) times daily as needed for muscle spasms. (Patient not taking: Reported on 12/26/2021) 15 tablet 0   sertraline (ZOLOFT) 50 MG tablet Take 1 tablet (50 mg total) by mouth daily. 30 tablet 1   No current facility-administered medications for this visit.    No Known Allergies  Diagnoses:  Generalized anxiety disorder  Plan of Care: Encouraged pt to work on developing self care activities between now and our follow up session on 02/11/22.   Jeremiah Bentley, [facility]

## 2022-02-10 ENCOUNTER — Encounter: Payer: Self-pay | Admitting: Family Medicine

## 2022-02-10 ENCOUNTER — Telehealth: Payer: 59 | Admitting: Family Medicine

## 2022-02-10 DIAGNOSIS — F410 Panic disorder [episodic paroxysmal anxiety] without agoraphobia: Secondary | ICD-10-CM

## 2022-02-10 MED ORDER — ALPRAZOLAM 0.5 MG PO TABS: 0.5000 mg | ORAL_TABLET | Freq: Two times a day (BID) | ORAL | 0 refills | Status: DC | PRN

## 2022-02-10 NOTE — Progress Notes (Signed)
   Subjective:    Patient ID: Hosie Spangle, male    DOB: January 15, 1986, 36 y.o.   MRN: [id number]  HPI Documentation for virtual audio and video telecommunications through Caregility encounter: The patient was located at home. 2 patient identifiers used.  The provider was located in the office. The patient did consent to this visit and is aware of possible charges through their insurance for this visit. The other persons participating in this telemedicine service were none. Time spent on call was 5 minutes and in review of previous records >25 minutes total for counseling and coordination of care. This virtual service is not related to other E/M service within previous 7 days.  Since last being seen by me he continues have difficulty with his anxiety.  He was seen by Dr. Lynelle Doctor and given instructions on how to take the Zoloft.  He did work himself up to the 50 mg dosing but was having multiple symptoms that he thinks were related to the Zoloft including dizziness, weakness, increased anxiety, insomnia.  Apparently he is off work this week.  He does not think that the Xanax is done him any good.  He did have a counseling session with [facility] by Maggie Font.  Apparently he mentioned the possibility of seeing his brother Dr. Meredith Staggers at [facility] and did talk to him.  Apparently he is in the process of getting an appointment.  He states that he did have 1 episode of suicidal ideation but states now that he has not had that and has no plan.  He thinks that the Zoloft is actually making things worse. Review of Systems     Objective:   Physical Exam Alert and in no distress with a slightly flat affect.       Assessment & Plan:  Panic attacks After discussion with him, he is to taper off the Zoloft down to 37-1/2 which she is already started for the next couple of days then down to 25 for couple days then he can stop.  I will increase his Xanax 2.5 mg twice per day.  He is to call  me later this week and let me know how he is doing.  He did have some concerns about getting hooked on Xanax and I explained that at this point not a major concern. He did give me a contract that he would not do that and if he ran into trouble he is to go to the psychiatric emergency care.

## 2022-02-11 ENCOUNTER — Ambulatory Visit (INDEPENDENT_AMBULATORY_CARE_PROVIDER_SITE_OTHER): Payer: 59 | Admitting: Psychology

## 2022-02-11 DIAGNOSIS — F411 Generalized anxiety disorder: Secondary | ICD-10-CM

## 2022-02-11 NOTE — Progress Notes (Signed)
[facility] Counselor/Therapist Progress Note  Patient ID: Jeremiah Bentley, MRN: [id number],    Date: 02/11/2022  Time Spent: 45 mins  Treatment Type: Individual Therapy  Reported Symptoms: Pt presented for follow up session in person in the office, granting consent for the session.  Mental Status Exam: Appearance:  Casual     Behavior: Appropriate  Motor: Normal  Speech/Language:  Clear and Coherent  Affect: Appropriate  Mood: normal  Thought process: normal  Thought content:   WNL  Sensory/Perceptual disturbances:   WNL  Orientation: oriented to person, place, and time/date  Attention: Good  Concentration: Good  Memory: WNL  Fund of knowledge:  Good  Insight:   Good  Judgment:  Good  Impulse Control: Good   Risk Assessment: Danger to Self:  No Self-injurious Behavior: No Danger to Others: No Duty to Warn:no Physical Aggression / Violence:No  Access to Firearms a concern: No  Gang Involvement:No   Subjective: Pt shares that he is weaning himself off of the Zoloft because he did not like the way it made him feel.  He has an appt with Avelina Laine, NP at [facility] on 8/23 for a medication evaluation.  Pt shares that he had some intrusive thoughts last weekend, while on the Zoloft.  He had thoughts that he "could just end it.  Those thoughts are not me and I am not going to take Zoloft anymore."  Pt shares he continues to work with his attorney to try to get a paternity test for the child he believes might be his son.  The woman involved Jeremiah Bentley) has told pt that her current boyfriend Jeremiah Bentley) is the father of the boy.  The court has order a paternity test; pt has been tested.  Jeremiah Bentley has not complied yet by taking the boy for testing.  She is in contempt of court at this point.  Pt continues to see Jeremiah Bentley and he enjoys that.  Jeremiah Bentley seems to be interested in developing a deeper relationship with pt.  Jeremiah Bentley has a 48 yo daughter Jeremiah Bentley, Jeremiah Bentley).  They have been  seeing each other for the past couple of years.  Pt shares he has not had any panic attacks since our first mtg last week.  Talked with pt about practicing deep breathing to combat any future panic attacks.  Encouraged pt to consider what self care activities he wants to engage in and to think about when he might want to engage in those.  We will meet in 2 wks for a follow up session.  Interventions: Cognitive Behavioral Therapy  Diagnosis:Generalized anxiety disorder  Plan:  Treatment Plan Strengths/Abilities:  Intelligent, Intuitive, Willing to participate in therapy Treatment Preferences:  Outpatient Individual Therapy Statement of Needs:  Patient is to use CBT, mindfulness and coping skills to help manage and/or decrease symptoms associated with their diagnosis. Symptoms:  Depressed/Irritable mood, worry, social withdrawal Problems Addressed:  Depressive thoughts, Sadness, Sleep issues, etc. Long Term Goals:  Pt to reduce overall level, frequency, and intensity of the feelings of anxiety as evidenced by decreased irritability, negative self talk, and helpless feelings from 6 to 7 days/week to 0 to 1 days/week, per client report, for at least 3 consecutive months.  Progress: 10% Short Term Goals:  Pt to verbally express understanding of the relationship between feelings of anxiety and their impact on thinking patterns and behaviors.  Pt to verbalize an understanding of the role that distorted thinking plays in creating fears, excessive worry, and ruminations.  Progress: 10%  Target Date:  02/12/2023 Frequency:  Bi-weekly Modality:  Cognitive Behavioral Therapy Interventions by Therapist:  Therapist will use CBT, Mindfulness exercises, Coping skills and Referrals, as needed by client. Client has verbally approved this treatment plan.  Karie Kirks, [facility]

## 2022-02-14 ENCOUNTER — Encounter: Payer: Self-pay | Admitting: Family Medicine

## 2022-02-25 ENCOUNTER — Other Ambulatory Visit: Payer: Self-pay | Admitting: Family Medicine

## 2022-02-25 ENCOUNTER — Ambulatory Visit: Payer: 59 | Admitting: Psychology

## 2022-02-25 DIAGNOSIS — F410 Panic disorder [episodic paroxysmal anxiety] without agoraphobia: Secondary | ICD-10-CM

## 2022-02-25 NOTE — Telephone Encounter (Signed)
Pharmacy is requesting 90 day supply of sertraline 50mg 

## 2022-02-26 ENCOUNTER — Encounter: Payer: Self-pay | Admitting: Behavioral Health

## 2022-02-26 ENCOUNTER — Ambulatory Visit (INDEPENDENT_AMBULATORY_CARE_PROVIDER_SITE_OTHER): Payer: 59 | Admitting: Behavioral Health

## 2022-02-26 VITALS — BP 103/70 | HR 82 | Ht 68.0 in | Wt 198.0 lb

## 2022-02-26 DIAGNOSIS — F330 Major depressive disorder, recurrent, mild: Secondary | ICD-10-CM

## 2022-02-26 DIAGNOSIS — F411 Generalized anxiety disorder: Secondary | ICD-10-CM

## 2022-02-26 DIAGNOSIS — F410 Panic disorder [episodic paroxysmal anxiety] without agoraphobia: Secondary | ICD-10-CM | POA: Diagnosis not present

## 2022-02-26 MED ORDER — HYDROXYZINE HCL 10 MG PO TABS: 10.0000 mg | ORAL_TABLET | Freq: Three times a day (TID) | ORAL | 0 refills | Status: DC | PRN

## 2022-02-26 MED ORDER — ESCITALOPRAM OXALATE 10 MG PO TABS: 10.0000 mg | ORAL_TABLET | Freq: Every day | ORAL | 1 refills | Status: DC

## 2022-02-26 MED ORDER — ALPRAZOLAM 0.5 MG PO TABS: 0.5000 mg | ORAL_TABLET | Freq: Two times a day (BID) | ORAL | 0 refills | Status: DC | PRN
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
## 2022-02-26 NOTE — Telephone Encounter (Signed)
Lvm for pt to find out if he is seeing a psychiatrist. [facility]

## 2022-03-07 ENCOUNTER — Encounter: Payer: Self-pay | Admitting: Behavioral Health

## 2022-03-12 ENCOUNTER — Encounter: Payer: Self-pay | Admitting: Internal Medicine

## 2022-03-20 ENCOUNTER — Other Ambulatory Visit: Payer: Self-pay | Admitting: Behavioral Health

## 2022-03-20 DIAGNOSIS — F410 Panic disorder [episodic paroxysmal anxiety] without agoraphobia: Secondary | ICD-10-CM

## 2022-03-20 DIAGNOSIS — F330 Major depressive disorder, recurrent, mild: Secondary | ICD-10-CM

## 2022-03-20 DIAGNOSIS — F411 Generalized anxiety disorder: Secondary | ICD-10-CM

## 2022-03-24 NOTE — Telephone Encounter (Signed)
Has apt Thursday, this was new medication he started at last visit. Hold until apt.

## 2022-03-27 ENCOUNTER — Encounter: Payer: Self-pay | Admitting: Behavioral Health

## 2022-03-27 ENCOUNTER — Other Ambulatory Visit: Payer: Self-pay | Admitting: Behavioral Health

## 2022-03-27 ENCOUNTER — Ambulatory Visit (INDEPENDENT_AMBULATORY_CARE_PROVIDER_SITE_OTHER): Payer: 59 | Admitting: Behavioral Health

## 2022-03-27 DIAGNOSIS — F410 Panic disorder [episodic paroxysmal anxiety] without agoraphobia: Secondary | ICD-10-CM

## 2022-03-27 DIAGNOSIS — F411 Generalized anxiety disorder: Secondary | ICD-10-CM

## 2022-03-27 DIAGNOSIS — F331 Major depressive disorder, recurrent, moderate: Secondary | ICD-10-CM | POA: Diagnosis not present

## 2022-03-27 MED ORDER — HYDROXYZINE HCL 25 MG PO TABS: 25.0000 mg | ORAL_TABLET | Freq: Three times a day (TID) | ORAL | 0 refills | Status: DC | PRN

## 2022-03-27 MED ORDER — ESCITALOPRAM OXALATE 20 MG PO TABS: 20.0000 mg | ORAL_TABLET | Freq: Every day | ORAL | 1 refills | Status: DC

## 2022-03-27 NOTE — Progress Notes (Signed)
[facility]  Patient ID: Jeremiah Bentley,  MRN: [id number]  PCP: Denita Lung, MD  Date of Evaluation: 03/27/2022 Time spent:30 minutes  Chief Complaint:  Chief Complaint   Anxiety; Depression; Follow-up; Medication Refill; Patient Education     HISTORY/CURRENT STATUS: HPI  "Jeremiah Bentley", 36 year old male presents to this office for initial visit and to establish care.  Says that he feels moderate improvement from Lexapro and would like to consider increasing the medication at this time. Says he is having a little diarrhea but hope this will improve. Trazodone has improved his sleep quality.  He says that his anxiety today is 3/10, and his depression today is 1/10.  Says that he is sleeping 7 to 8 hours every night.  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  He denies any history of mania, no psychosis, no auditory or visual hallucinations.  No SI or HI.   Past psychotropic medication trials: Zoloft Celexa     Individual Medical History/ Review of Systems: Changes? :No   Allergies: Patient has no known allergies.  Current Medications:  Current Outpatient Medications:    acetaminophen (TYLENOL) 500 MG tablet, Take 1,000 mg by mouth every 6 (six) hours as needed., Disp: , Rfl:    ALPRAZolam (XANAX) 0.5 MG tablet, Take 1 tablet (0.5 mg total) by mouth 2 (two) times daily as needed for anxiety., Disp: 20 tablet, Rfl: 0   cyclobenzaprine (FLEXERIL) 10 MG tablet, Take 0.5-1 tablets (5-10 mg total) by mouth 3 (three) times daily as needed for muscle spasms. (Patient not taking: Reported on 12/26/2021), Disp: 15 tablet, Rfl: 0   escitalopram (LEXAPRO) 10 MG tablet, Take 1 tablet (10 mg total) by mouth daily., Disp: 30 tablet, Rfl: 1   hydrOXYzine (ATARAX) 10 MG tablet, Take 1 tablet (10 mg total) by mouth 3 (three) times daily as needed., Disp: 30 tablet, Rfl: 0   sertraline (ZOLOFT) 50 MG tablet, Take 1 tablet (50 mg total) by  mouth daily., Disp: 30 tablet, Rfl: 1 Medication Side Effects: none  Family Medical/ Social History: Changes? No  MENTAL HEALTH EXAM:  There were no vitals taken for this visit.There is no height or weight on file to calculate BMI.  General Appearance: Casual, Neat, and Well Groomed  Eye Contact:  Good  Speech:  Clear and Coherent  Volume:  Normal  Mood:  Anxious and Depressed  Affect:  Congruent, Depressed, Flat, and Anxious  Thought Process:  Coherent  Orientation:  Full (Time, Place, and Person)  Thought Content: Logical   Suicidal Thoughts:  No  Homicidal Thoughts:  No  Memory:  WNL  Judgement:  Good  Insight:  Good  Psychomotor Activity:  Normal  Concentration:  Concentration: Good  Recall:  Good  Fund of Knowledge: Good  Language: Good  Assets:  Desire for Improvement  ADL's:  Intact  Cognition: WNL  Prognosis:  Good    DIAGNOSES:    ICD-10-CM   1. Generalized anxiety disorder  F41.1     2. Major depressive disorder, recurrent episode, moderate (HCC)  F33.1     3. Panic attacks  F41.0       Receiving Psychotherapy: No    RECOMMENDATIONS:   Greater than 50% of  30 min face to face time with patient was spent on counseling and coordination of care. We discussed his moderate improvement with Lexapro. Says he has done very well but does experience some frequent diarrhea. He is drinking plenty  of fluids and understands that this may not be from the medicine and may pass in time. Will continue to monitor closely. We agreed to: To increase Lexapro to 20  mg daily Increase hydroxyzine to 25 mg 3 times daily as needed Will contact this office for worsening symptoms or side effects Provided emergency contact information To follow-up in 8 weeks to reassess Discussed potential benefits, risk, and side effects of benzodiazepines to include potential risk of tolerance and dependence, as well as possible drowsiness.  Advised patient not to drive if experiencing  drowsiness and to take lowest possible effective dose to minimize risk of dependence and tolerance.  Reviewed PDMP     Joan Flores, NP

## 2022-03-28 NOTE — Telephone Encounter (Signed)
Pharmacy stated there is an interaction between the 2 meds ok to fill?

## 2022-04-15 ENCOUNTER — Encounter: Payer: Self-pay | Admitting: Internal Medicine

## 2022-04-20 ENCOUNTER — Other Ambulatory Visit: Payer: Self-pay | Admitting: Behavioral Health

## 2022-04-20 DIAGNOSIS — F411 Generalized anxiety disorder: Secondary | ICD-10-CM

## 2022-04-20 DIAGNOSIS — F410 Panic disorder [episodic paroxysmal anxiety] without agoraphobia: Secondary | ICD-10-CM

## 2022-04-20 DIAGNOSIS — F331 Major depressive disorder, recurrent, moderate: Secondary | ICD-10-CM

## 2022-04-28 ENCOUNTER — Encounter: Payer: Self-pay | Admitting: Internal Medicine

## 2022-05-21 ENCOUNTER — Ambulatory Visit (INDEPENDENT_AMBULATORY_CARE_PROVIDER_SITE_OTHER): Payer: 59 | Admitting: Behavioral Health

## 2022-05-21 ENCOUNTER — Encounter: Payer: Self-pay | Admitting: Behavioral Health

## 2022-05-21 DIAGNOSIS — F331 Major depressive disorder, recurrent, moderate: Secondary | ICD-10-CM

## 2022-05-21 DIAGNOSIS — F410 Panic disorder [episodic paroxysmal anxiety] without agoraphobia: Secondary | ICD-10-CM | POA: Diagnosis not present

## 2022-05-21 DIAGNOSIS — F411 Generalized anxiety disorder: Secondary | ICD-10-CM | POA: Diagnosis not present

## 2022-05-21 MED ORDER — TRAZODONE HCL 50 MG PO TABS: 50.0000 mg | ORAL_TABLET | Freq: Every day | ORAL | 1 refills | Status: DC

## 2022-05-21 MED ORDER — ESCITALOPRAM OXALATE 20 MG PO TABS: 20.0000 mg | ORAL_TABLET | Freq: Every day | ORAL | 1 refills | Status: DC
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
## 2022-06-03 ENCOUNTER — Telehealth: Payer: Self-pay | Admitting: Behavioral Health

## 2022-06-03 NOTE — Telephone Encounter (Signed)
Pt informed

## 2022-06-03 NOTE — Telephone Encounter (Signed)
Have him try taking a half a tablet of Trazodone. Typically Trazodone does not increase anxiety. The Lexapro was working well for him last visit as he reported.

## 2022-06-03 NOTE — Telephone Encounter (Signed)
Pt called and said that ever since he started the trazadone he has felt off. He is sleeping but with the lexapro he feels tired more depressed and the last two mornings he feels like he is having a panic attack. He thinks he needs to ween off the trazodone. Please call him at [phone]

## 2022-06-03 NOTE — Telephone Encounter (Signed)
Please review

## 2022-06-15 ENCOUNTER — Other Ambulatory Visit: Payer: Self-pay | Admitting: Behavioral Health

## 2022-06-15 DIAGNOSIS — F411 Generalized anxiety disorder: Secondary | ICD-10-CM

## 2022-06-15 DIAGNOSIS — F331 Major depressive disorder, recurrent, moderate: Secondary | ICD-10-CM

## 2022-06-15 DIAGNOSIS — F410 Panic disorder [episodic paroxysmal anxiety] without agoraphobia: Secondary | ICD-10-CM

## 2022-07-21 ENCOUNTER — Ambulatory Visit (INDEPENDENT_AMBULATORY_CARE_PROVIDER_SITE_OTHER): Payer: 59 | Admitting: Behavioral Health

## 2022-07-21 ENCOUNTER — Encounter: Payer: Self-pay | Admitting: Behavioral Health

## 2022-07-21 DIAGNOSIS — F330 Major depressive disorder, recurrent, mild: Secondary | ICD-10-CM

## 2022-07-21 DIAGNOSIS — F411 Generalized anxiety disorder: Secondary | ICD-10-CM

## 2022-07-21 DIAGNOSIS — F410 Panic disorder [episodic paroxysmal anxiety] without agoraphobia: Secondary | ICD-10-CM | POA: Diagnosis not present

## 2022-07-21 MED ORDER — ESCITALOPRAM OXALATE 10 MG PO TABS: 10.0000 mg | ORAL_TABLET | Freq: Every day | ORAL | 1 refills | Status: DC
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
## 2022-08-25 ENCOUNTER — Other Ambulatory Visit: Payer: Self-pay | Admitting: Behavioral Health

## 2022-08-25 DIAGNOSIS — F410 Panic disorder [episodic paroxysmal anxiety] without agoraphobia: Secondary | ICD-10-CM

## 2022-08-25 DIAGNOSIS — F331 Major depressive disorder, recurrent, moderate: Secondary | ICD-10-CM

## 2022-08-25 DIAGNOSIS — F411 Generalized anxiety disorder: Secondary | ICD-10-CM

## 2022-10-20 ENCOUNTER — Ambulatory Visit (INDEPENDENT_AMBULATORY_CARE_PROVIDER_SITE_OTHER): Payer: 59 | Admitting: Behavioral Health

## 2022-10-20 ENCOUNTER — Encounter: Payer: Self-pay | Admitting: Behavioral Health

## 2022-10-20 DIAGNOSIS — F410 Panic disorder [episodic paroxysmal anxiety] without agoraphobia: Secondary | ICD-10-CM

## 2022-10-20 DIAGNOSIS — F331 Major depressive disorder, recurrent, moderate: Secondary | ICD-10-CM

## 2022-10-20 DIAGNOSIS — F411 Generalized anxiety disorder: Secondary | ICD-10-CM

## 2022-10-20 MED ORDER — ESCITALOPRAM OXALATE 20 MG PO TABS: 20.0000 mg | ORAL_TABLET | Freq: Every day | ORAL | 0 refills | Status: DC

## 2022-10-20 NOTE — Progress Notes (Signed)
[facility]  Patient ID: Jeremiah Bentley,  MRN: [id number]  PCP: Ronnald Nian, MD  Date of Evaluation: 10/20/2022 Time spent:30 minutes  Chief Complaint:  Chief Complaint   Anxiety; Depression; Follow-up; Medication Refill; Patient Education     HISTORY/CURRENT STATUS: HPI  Jeremiah Bentley", 37 year old male presents to this office for initial visit and to establish care.  Says that he feels significant improvement from Lexapro since increasing the dose to 20 mg.  No changes this visit. He is happy with his medication right now and does not want to make any changes.  Diarrhea has improved. Will increase fiber intake. He says that his anxiety today is 3/10, and his depression today is 1/10.  Says that he is sleeping 7-8 hours every night.  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 Right now increased stress due to mother of his child preventing him from seeing and currently in legal battle for rights.   He denies any history of mania, no psychosis, no auditory or visual hallucinations.  No SI or HI.   Past psychotropic medication trials: Zoloft Celexa      Individual Medical History/ Review of Systems: Changes? :No   Allergies: Patient has no known allergies.  Current Medications:  Current Outpatient Medications:    escitalopram (LEXAPRO) 20 MG tablet, TAKE 1 TABLET BY MOUTH EVERY DAY, Disp: 90 tablet, Rfl: 0   acetaminophen (TYLENOL) 500 MG tablet, Take 1,000 mg by mouth every 6 (six) hours as needed., Disp: , Rfl:    ALPRAZolam (XANAX) 0.5 MG tablet, Take 1 tablet (0.5 mg total) by mouth 2 (two) times daily as needed for anxiety., Disp: 20 tablet, Rfl: 0   cyclobenzaprine (FLEXERIL) 10 MG tablet, Take 0.5-1 tablets (5-10 mg total) by mouth 3 (three) times daily as needed for muscle spasms. (Patient not taking: Reported on 12/26/2021), Disp: 15 tablet, Rfl: 0   escitalopram (LEXAPRO) 10 MG tablet, Take 1 tablet (10 mg total) by  mouth daily., Disp: 30 tablet, Rfl: 1   hydrOXYzine (ATARAX) 10 MG tablet, Take 1 tablet (10 mg total) by mouth 3 (three) times daily as needed., Disp: 30 tablet, Rfl: 0 Medication Side Effects: none  Family Medical/ Social History: Changes? No  MENTAL HEALTH EXAM:  There were no vitals taken for this visit.There is no height or weight on file to calculate BMI.  General Appearance: Casual, Neat, and Well Groomed  Eye Contact:  Good  Speech:  Clear and Coherent  Volume:  Normal  Mood:  NA  Affect:  Appropriate  Thought Process:  Coherent  Orientation:  Full (Time, Place, and Person)  Thought Content: Logical   Suicidal Thoughts:  No  Homicidal Thoughts:  No  Memory:  WNL  Judgement:  Good  Insight:  Good  Psychomotor Activity:  Normal  Concentration:  Concentration: Good  Recall:  Good  Fund of Knowledge: Good  Language: Good  Assets:  Desire for Improvement  ADL's:  Intact  Cognition: WNL  Prognosis:  Good    DIAGNOSES:    ICD-10-CM   1. Panic attacks  F41.0     2. Major depressive disorder, recurrent episode, moderate  F33.1     3. Generalized anxiety disorder  F41.1       Receiving Psychotherapy: No    RECOMMENDATIONS:   Greater than 50% of  20 min face to face time with patient was spent on counseling and coordination of care. We discussed his significant improvement  with Lexapro.   Sleep has improved.  We agreed to: To continue Lexapro to 20  mg daily To continue Trazodone 50 mg at bedtime.  Will contact this office for worsening symptoms or side effects Provided emergency contact information To follow-up in 12 weeks to reassess Discussed potential benefits, risk, and side effects of benzodiazepines to include potential risk of tolerance and dependence, as well as possible drowsiness.  Advised patient not to drive if experiencing drowsiness and to take lowest possible effective dose to minimize risk of dependence and tolerance.  Reviewed PDMP   Joan Flores, NP

## 2023-01-16 ENCOUNTER — Encounter: Payer: 59 | Admitting: Family Medicine

## 2023-01-20 ENCOUNTER — Encounter: Payer: 59 | Admitting: Family Medicine

## 2023-01-26 ENCOUNTER — Ambulatory Visit: Payer: 59 | Admitting: Behavioral Health

## 2023-01-30 ENCOUNTER — Ambulatory Visit: Payer: 59 | Admitting: Behavioral Health

## 2023-01-30 ENCOUNTER — Encounter: Payer: Self-pay | Admitting: Behavioral Health

## 2023-01-30 DIAGNOSIS — F331 Major depressive disorder, recurrent, moderate: Secondary | ICD-10-CM

## 2023-01-30 DIAGNOSIS — F410 Panic disorder [episodic paroxysmal anxiety] without agoraphobia: Secondary | ICD-10-CM | POA: Diagnosis not present

## 2023-01-30 DIAGNOSIS — F411 Generalized anxiety disorder: Secondary | ICD-10-CM | POA: Diagnosis not present

## 2023-01-30 MED ORDER — ALPRAZOLAM 0.5 MG PO TABS: 0.5000 mg | ORAL_TABLET | Freq: Two times a day (BID) | ORAL | 0 refills | Status: DC | PRN

## 2023-01-30 MED ORDER — ESCITALOPRAM OXALATE 20 MG PO TABS: 20.0000 mg | ORAL_TABLET | Freq: Every day | ORAL | 1 refills | Status: DC

## 2023-01-30 NOTE — Progress Notes (Signed)
[facility]  Patient ID: Jeremiah Bentley,  MRN: [id number]  PCP: Ronnald Nian, MD  Date of Evaluation: 01/30/2023 Time spent:30 minutes  Chief Complaint:  Chief Complaint   Anxiety; Depression; Follow-up; Medication Refill; Family Problem; Patient Education     HISTORY/CURRENT STATUS: HPI Jeremiah Bentley", 37 year old male presents to this office for initial visit and to establish care.  Says that he feels significant improvement from Lexapro since increasing the dose to 20 mg.  No changes this visit. He is happy with his medication right now and does not want to make any changes.  Diarrhea has improved. Will increase fiber intake. He says that his anxiety today is 3/10, and his depression today is 1/10.  Says that he is sleeping 7-8 hours every night.  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 He denies any history of mania, no psychosis, no auditory or visual hallucinations.  No SI or HI.   Past psychotropic medication trials: Zoloft Celexa       Individual Medical History/ Review of Systems: Changes? :No   Allergies: Patient has no known allergies.  Current Medications:  Current Outpatient Medications:    acetaminophen (TYLENOL) 500 MG tablet, Take 1,000 mg by mouth every 6 (six) hours as needed., Disp: , Rfl:    ALPRAZolam (XANAX) 0.5 MG tablet, Take 1 tablet (0.5 mg total) by mouth 2 (two) times daily as needed for anxiety., Disp: 20 tablet, Rfl: 0   cyclobenzaprine (FLEXERIL) 10 MG tablet, Take 0.5-1 tablets (5-10 mg total) by mouth 3 (three) times daily as needed for muscle spasms. (Patient not taking: Reported on 12/26/2021), Disp: 15 tablet, Rfl: 0   escitalopram (LEXAPRO) 10 MG tablet, Take 1 tablet (10 mg total) by mouth daily., Disp: 30 tablet, Rfl: 1   escitalopram (LEXAPRO) 20 MG tablet, Take 1 tablet (20 mg total) by mouth daily., Disp: 90 tablet, Rfl: 1   hydrOXYzine  (ATARAX) 10 MG tablet, Take 1 tablet (10 mg total) by mouth 3 (three) times daily as needed., Disp: 30 tablet, Rfl: 0 Medication Side Effects: none  Family Medical/ Social History: Changes? No  MENTAL HEALTH EXAM:  There were no vitals taken for this visit.There is no height or weight on file to calculate BMI.  General Appearance: Casual, Neat, and Well Groomed  Eye Contact:  Good  Speech:  Clear and Coherent  Volume:  Normal  Mood:  Anxious  Affect:  Appropriate  Thought Process:  Coherent  Orientation:  Full (Time, Place, and Person)  Thought Content: Logical   Suicidal Thoughts:  No  Homicidal Thoughts:  No  Memory:  WNL  Judgement:  Good  Insight:  Good  Psychomotor Activity:  Normal  Concentration:  Concentration: Good  Recall:  Good  Fund of Knowledge: Good  Language: Good  Assets:  Desire for Improvement  ADL's:  Intact  Cognition: WNL  Prognosis:  Good    DIAGNOSES:    ICD-10-CM   1. Panic attacks  F41.0 escitalopram (LEXAPRO) 20 MG tablet    ALPRAZolam (XANAX) 0.5 MG tablet    2. Major depressive disorder, recurrent episode, moderate (HCC)  F33.1 escitalopram (LEXAPRO) 20 MG tablet    3. Generalized anxiety disorder  F41.1 escitalopram (LEXAPRO) 20 MG tablet      Receiving Psychotherapy: No    RECOMMENDATIONS:   Greater than 50% of  30 min face to face time with patient was spent on counseling  and coordination of care. We discussed his significant improvement with Lexapro.   Sleep has improved. We also discussed his continuing legal trouble in fighting for custody of his child. Will be providing documentation to be filled out.  We agreed to: To continue Lexapro to 20  mg daily To continue Trazodone 50 mg at bedtime.  Will contact this office for worsening symptoms or side effects Provided emergency contact information To follow-up in 12 weeks to reassess Discussed potential benefits, risk, and side effects of benzodiazepines to include potential risk  of tolerance and dependence, as well as possible drowsiness.  Advised patient not to drive if experiencing drowsiness and to take lowest possible effective dose to minimize risk of dependence and tolerance.  Reviewed PDMP     Joan Flores, NP

## 2023-03-10 ENCOUNTER — Encounter: Payer: 59 | Admitting: Family Medicine

## 2023-03-10 ENCOUNTER — Ambulatory Visit (INDEPENDENT_AMBULATORY_CARE_PROVIDER_SITE_OTHER): Payer: 59 | Admitting: Family Medicine

## 2023-03-10 ENCOUNTER — Encounter: Payer: Self-pay | Admitting: Family Medicine

## 2023-03-10 VITALS — BP 120/78 | HR 68 | Ht 69.0 in | Wt 219.0 lb

## 2023-03-10 DIAGNOSIS — F331 Major depressive disorder, recurrent, moderate: Secondary | ICD-10-CM

## 2023-03-10 DIAGNOSIS — Z8616 Personal history of COVID-19: Secondary | ICD-10-CM

## 2023-03-10 DIAGNOSIS — F410 Panic disorder [episodic paroxysmal anxiety] without agoraphobia: Secondary | ICD-10-CM | POA: Diagnosis not present

## 2023-03-10 DIAGNOSIS — Z0000 Encounter for general adult medical examination without abnormal findings: Secondary | ICD-10-CM

## 2023-03-10 DIAGNOSIS — F411 Generalized anxiety disorder: Secondary | ICD-10-CM

## 2023-03-10 DIAGNOSIS — K219 Gastro-esophageal reflux disease without esophagitis: Secondary | ICD-10-CM | POA: Diagnosis not present

## 2023-03-10 DIAGNOSIS — K2100 Gastro-esophageal reflux disease with esophagitis, without bleeding: Secondary | ICD-10-CM

## 2023-03-10 DIAGNOSIS — M7712 Lateral epicondylitis, left elbow: Secondary | ICD-10-CM

## 2023-03-10 NOTE — Patient Instructions (Addendum)
Do as many things as you can with your palms up and open.  You can use the brace but keep an inch below where the pain is.  You can take 2 Aleve twice per day to help with the pain and inflammation.   Use Pepcid or Axson daily and see if that will take care of it if it does not then double it and if that did not work and call me

## 2023-03-10 NOTE — Progress Notes (Signed)
Complete physical exam  Patient: Jeremiah Bentley   DOB: 02-Jan-1986   37 y.o. Male  MRN: [id number]  Subjective:    Chief Complaint  Patient presents with   Annual Exam    Fasting annual exam. Has had some left arm pain since July. Thinks it might be "tennis elbow" he did get a small brace. Feel like his grip strength is decreased. Reflux has been bad for 1 year. He has worked on foods in his diet but it has not helped. He was started on Lexapro about a year ago and wonders if this has contributed. Uses Tums. Took something his dad gave him (rx) and it helped, not sure what it was.     Jeremiah Bentley is a 37 y.o. male who presents today for a complete physical exam. He reports consuming a general diet. The patient does not participate in regular exercise at present. He generally feels well. He reports sleeping fairly well. He does not have additional problems to discuss today.  He does complain of left forearm pain that started after he did a lot of digging on the beach in the sand.  He did read up on it on [employer] and has been using a forearm brace to help.  He also has been having reflux symptoms and has been taking Tums on a daily basis to help with that.  He continues to be followed by psychiatry and is involved in counseling.  He has had difficulty with the mother of his 51-year-old child legally but his lawyer is helping him getting things squared away.  Apparently there is a contempt order issued which should help him legally.  Otherwise he has no particular concerns or complaints.   Most recent fall risk assessment:    03/10/2023    1:24 PM  Fall Risk   Falls in the past year? 0  Number falls in past yr: 0  Injury with Fall? 0  Risk for fall due to : No Fall Risks  Follow up Falls evaluation completed     Most recent depression screenings:    03/10/2023    1:24 PM 03/27/2022    4:55 PM  PHQ 2/9 Scores  PHQ - 2 Score 0   PHQ- 9 Score 3      Information is confidential and  restricted. Go to Review Flowsheets to unlock data.    Vision:Not within last year     Patient Care Team: Ronnald Nian, MD as PCP - General (Family Medicine)   Outpatient Medications Prior to Visit  Medication Sig Note   escitalopram (LEXAPRO) 20 MG tablet Take 1 tablet (20 mg total) by mouth daily.    MAGNESIUM GLYCINATE PO Take 500 mg by mouth daily.    acetaminophen (TYLENOL) 500 MG tablet Take 1,000 mg by mouth every 6 (six) hours as needed. (Patient not taking: Reported on 03/10/2023) 03/10/2023: As needed   ALPRAZolam (XANAX) 0.5 MG tablet Take 1 tablet (0.5 mg total) by mouth 2 (two) times daily as needed for anxiety. (Patient not taking: Reported on 03/10/2023) 03/10/2023: As needed   hydrOXYzine (ATARAX) 10 MG tablet Take 1 tablet (10 mg total) by mouth 3 (three) times daily as needed. (Patient not taking: Reported on 03/10/2023) 03/10/2023: As needed   [DISCONTINUED] cyclobenzaprine (FLEXERIL) 10 MG tablet Take 0.5-1 tablets (5-10 mg total) by mouth 3 (three) times daily as needed for muscle spasms. (Patient not taking: Reported on 12/26/2021)    [DISCONTINUED] escitalopram (LEXAPRO) 10 MG tablet  Take 1 tablet (10 mg total) by mouth daily.    No facility-administered medications prior to visit.    Review of Systems  All other systems reviewed and are negative.         Objective:     BP 120/78   Pulse 68   Ht 5\' 9"  (1.753 m)   Wt 219 lb (99.3 kg)   BMI 32.34 kg/m    Physical Exam   Alert and in no distress. Tympanic membranes and canals are normal. Pharyngeal area is normal. Neck is supple without adenopathy or thyromegaly. Cardiac exam shows a regular sinus rhythm without murmurs or gallops. Lungs are clear to auscultation.  Abdominal and genital exam is normal.  Exam of the left arm does show tenderness palpation over the lateral epicondyle.     Assessment & Plan:    Routine general medical examination at a health care facility - Plan: CBC with Differential/Platelet,  Comprehensive metabolic panel, Lipid panel  Panic attacks  History of COVID-19  Gastroesophageal reflux disease without esophagitis  Major depressive disorder, recurrent episode, moderate (HCC)  Generalized anxiety disorder  Lateral epicondylitis of left elbow  Gastroesophageal reflux disease with esophagitis without hemorrhage  Immunization History  Administered Date(s) Administered   Influenza Split 03/05/2010   PFIZER(Purple Top)SARS-COV-2 Vaccination 07/04/2020, 08/01/2020   Tdap 01/04/2020    Health Maintenance  Topic Date Due   COVID-19 Vaccine (3 - 2023-24 season) 03/26/2023 (Originally 03/08/2023)   INFLUENZA VACCINE  10/05/2023 (Originally 02/05/2023)   DTaP/Tdap/Td (2 - Td or Tdap) 01/03/2030   Hepatitis C Screening  Completed   HIV Screening  Completed   HPV VACCINES  Aged Out   Do as many things as you can with your palms up and open.  You can use the brace but keep an inch below where the pain is.  You can take 2 Aleve twice per day to help with the pain and inflammation.   Use Pepcid or Axson daily and see if that will take care of it if it does not then double it and if that did not work and call me Discussed health benefits of physical activity, and encouraged him to engage in regular exercise appropriate for his age and condition.  Problem List Items Addressed This Visit     Gastroesophageal reflux disease without esophagitis   History of COVID-19   Panic attacks   Other Visit Diagnoses     Routine general medical examination at a health care facility    -  Primary   Relevant Orders   CBC with Differential/Platelet   Comprehensive metabolic panel   Lipid panel   Major depressive disorder, recurrent episode, moderate (HCC)       Generalized anxiety disorder       Lateral epicondylitis of left elbow       Gastroesophageal reflux disease with esophagitis without hemorrhage          Follow-up 1 year    Jeremiah Gowda, MD

## 2023-03-11 ENCOUNTER — Encounter: Payer: Self-pay | Admitting: Family Medicine

## 2023-03-11 LAB — CBC WITH DIFFERENTIAL/PLATELET
Basophils Absolute: 0.1 10*3/uL (ref 0.0–0.2)
Basos: 1 %
EOS (ABSOLUTE): 0.3 10*3/uL (ref 0.0–0.4)
Eos: 3 %
Hematocrit: 46.3 % (ref 37.5–51.0)
Hemoglobin: 15.5 g/dL (ref 13.0–17.7)
Immature Grans (Abs): 0 10*3/uL (ref 0.0–0.1)
Immature Granulocytes: 0 %
Lymphocytes Absolute: 3.2 10*3/uL — ABNORMAL HIGH (ref 0.7–3.1)
Lymphs: 30 %
MCH: 29.8 pg (ref 26.6–33.0)
MCHC: 33.5 g/dL (ref 31.5–35.7)
MCV: 89 fL (ref 79–97)
Monocytes Absolute: 0.7 10*3/uL (ref 0.1–0.9)
Monocytes: 7 %
Neutrophils Absolute: 6.4 10*3/uL (ref 1.4–7.0)
Neutrophils: 59 %
Platelets: 316 10*3/uL (ref 150–450)
RBC: 5.21 x10E6/uL (ref 4.14–5.80)
RDW: 12 % (ref 11.6–15.4)
WBC: 10.7 10*3/uL (ref 3.4–10.8)

## 2023-03-11 LAB — COMPREHENSIVE METABOLIC PANEL
ALT: 30 IU/L (ref 0–44)
AST: 18 IU/L (ref 0–40)
Albumin: 4.6 g/dL (ref 4.1–5.1)
Alkaline Phosphatase: 48 IU/L (ref 44–121)
BUN/Creatinine Ratio: 11 (ref 9–20)
BUN: 13 mg/dL (ref 6–20)
Bilirubin Total: 0.5 mg/dL (ref 0.0–1.2)
CO2: 22 mmol/L (ref 20–29)
Calcium: 9.5 mg/dL (ref 8.7–10.2)
Chloride: 106 mmol/L (ref 96–106)
Creatinine, Ser: 1.14 mg/dL (ref 0.76–1.27)
Globulin, Total: 2.3 g/dL (ref 1.5–4.5)
Glucose: 85 mg/dL (ref 70–99)
Potassium: 4.2 mmol/L (ref 3.5–5.2)
Sodium: 143 mmol/L (ref 134–144)
Total Protein: 6.9 g/dL (ref 6.0–8.5)
eGFR: 86 mL/min/{1.73_m2} (ref >59)

## 2023-03-11 LAB — LIPID PANEL
Chol/HDL Ratio: 8.3 ratio — ABNORMAL HIGH (ref 0.0–5.0)
Cholesterol, Total: 241 mg/dL — ABNORMAL HIGH (ref 100–199)
HDL: 29 mg/dL — ABNORMAL LOW (ref >39)
LDL Chol Calc (NIH): 179 mg/dL — ABNORMAL HIGH (ref 0–99)
Triglycerides: 174 mg/dL — ABNORMAL HIGH (ref 0–149)
VLDL Cholesterol Cal: 33 mg/dL (ref 5–40)

## 2023-05-01 ENCOUNTER — Ambulatory Visit (INDEPENDENT_AMBULATORY_CARE_PROVIDER_SITE_OTHER): Payer: 59 | Admitting: Behavioral Health

## 2023-05-01 ENCOUNTER — Encounter: Payer: Self-pay | Admitting: Behavioral Health

## 2023-05-01 DIAGNOSIS — F410 Panic disorder [episodic paroxysmal anxiety] without agoraphobia: Secondary | ICD-10-CM | POA: Diagnosis not present

## 2023-05-01 DIAGNOSIS — F411 Generalized anxiety disorder: Secondary | ICD-10-CM | POA: Diagnosis not present

## 2023-05-01 DIAGNOSIS — F331 Major depressive disorder, recurrent, moderate: Secondary | ICD-10-CM | POA: Diagnosis not present

## 2023-05-01 DIAGNOSIS — F330 Major depressive disorder, recurrent, mild: Secondary | ICD-10-CM

## 2023-05-01 MED ORDER — HYDROXYZINE HCL 10 MG PO TABS: 10.0000 mg | ORAL_TABLET | Freq: Three times a day (TID) | ORAL | 0 refills | Status: DC | PRN

## 2023-05-01 MED ORDER — ESCITALOPRAM OXALATE 20 MG PO TABS: 20.0000 mg | ORAL_TABLET | Freq: Every day | ORAL | 1 refills | Status: DC

## 2023-05-01 NOTE — Progress Notes (Signed)
[facility]  Patient ID: Jeremiah Bentley,  MRN: [id number]  PCP: Ronnald Nian, MD  Date of Evaluation: 05/01/2023 Time spent:20 minutes  Chief Complaint:  Chief Complaint   Anxiety; Depression; Follow-up; Patient Education; Medication Refill     HISTORY/CURRENT STATUS: HPI  Jeremiah Bentley", 37 year old male presents to this office for follow up and medication management.  Says that he feels significant improvement from Lexapro since increasing the dose to 20 mg.  No changes this visit. He is happy with his medication right now and does not want to make any changes.  Says that he is in much better shape with his custody battle and has hopes he will get 50% custody of his child.  He says that his anxiety today is 4/10, and his depression today is 0/10.  Says that he is sleeping 7-8 hours every night.  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 He denies any history of mania, no psychosis, no auditory or visual hallucinations.  No SI or HI.   Past psychotropic medication trials: Zoloft Celexa    Individual Medical History/ Review of Systems: Changes? :No   Allergies: Patient has no known allergies.  Current Medications:  Current Outpatient Medications:    acetaminophen (TYLENOL) 500 MG tablet, Take 1,000 mg by mouth every 6 (six) hours as needed. (Patient not taking: Reported on 03/10/2023), Disp: , Rfl:    ALPRAZolam (XANAX) 0.5 MG tablet, Take 1 tablet (0.5 mg total) by mouth 2 (two) times daily as needed for anxiety. (Patient not taking: Reported on 03/10/2023), Disp: 20 tablet, Rfl: 0   escitalopram (LEXAPRO) 20 MG tablet, Take 1 tablet (20 mg total) by mouth daily., Disp: 90 tablet, Rfl: 1   hydrOXYzine (ATARAX) 10 MG tablet, Take 1 tablet (10 mg total) by mouth 3 (three) times daily as needed., Disp: 90 tablet, Rfl: 0   MAGNESIUM GLYCINATE PO, Take 500 mg by mouth daily., Disp: , Rfl:   Medication Side Effects: none  Family Medical/ Social History: Changes? No  MENTAL HEALTH EXAM:  There were no vitals taken for this visit.There is no height or weight on file to calculate BMI.  General Appearance: Casual, Neat, and Well Groomed  Eye Contact:  Good  Speech:  Clear and Coherent  Volume:  Normal  Mood:  NA  Affect:  Appropriate  Thought Process:  Coherent  Orientation:  Full (Time, Place, and Person)  Thought Content: Logical   Suicidal Thoughts:  No  Homicidal Thoughts:  No  Memory:  WNL  Judgement:  Good  Insight:  Good  Psychomotor Activity:  Normal  Concentration:  Concentration: Good  Recall:  Good  Fund of Knowledge: Good  Language: Good  Assets:  Desire for Improvement  ADL's:  Intact  Cognition: WNL  Prognosis:  Good    DIAGNOSES:    ICD-10-CM   1. Generalized anxiety disorder  F41.1 escitalopram (LEXAPRO) 20 MG tablet    hydrOXYzine (ATARAX) 10 MG tablet    2. Mild episode of recurrent major depressive disorder (HCC)  F33.0     3. Panic attacks  F41.0 escitalopram (LEXAPRO) 20 MG tablet    hydrOXYzine (ATARAX) 10 MG tablet    4. Major depressive disorder, recurrent episode, moderate (HCC)  F33.1 escitalopram (LEXAPRO) 20 MG tablet      Receiving Psychotherapy: No    RECOMMENDATIONS:    Greater than 50% of  30 min face to face  time with patient was spent on counseling and coordination of care. We discussed his significant improvement with Lexapro.   Sleep has improved. We also discussed his continuing legal action to get partial custody of child.  We agreed to:  To continue Lexapro to 20  mg daily To continue hydroxyzine 25 mg three times daily as needed  Will contact this office for worsening symptoms or side effects Provided emergency contact information To follow-up in 12 weeks to reassess Discussed potential benefits, risk, and side effects of benzodiazepines to include potential risk of tolerance and dependence, as well as  possible drowsiness.  Advised patient not to drive if experiencing drowsiness and to take lowest possible effective dose to minimize risk of dependence and tolerance.  Reviewed PDMP    Joan Flores, NP

## 2023-05-24 ENCOUNTER — Other Ambulatory Visit: Payer: Self-pay | Admitting: Behavioral Health

## 2023-05-24 DIAGNOSIS — F410 Panic disorder [episodic paroxysmal anxiety] without agoraphobia: Secondary | ICD-10-CM

## 2023-05-24 DIAGNOSIS — F411 Generalized anxiety disorder: Secondary | ICD-10-CM

## 2023-05-24 NOTE — Telephone Encounter (Signed)
To continue hydroxyzine 25 mg three times daily as needed   Verify dosing

## 2023-05-25 NOTE — Telephone Encounter (Signed)
Patient said he was using for sleep and hasn't needed it, does not need a RF.

## 2023-08-27 ENCOUNTER — Other Ambulatory Visit: Payer: Self-pay | Admitting: Behavioral Health

## 2023-08-27 DIAGNOSIS — F411 Generalized anxiety disorder: Secondary | ICD-10-CM

## 2023-08-27 DIAGNOSIS — F410 Panic disorder [episodic paroxysmal anxiety] without agoraphobia: Secondary | ICD-10-CM

## 2023-09-01 ENCOUNTER — Encounter: Payer: Self-pay | Admitting: Internal Medicine

## 2023-09-01 ENCOUNTER — Encounter: Payer: Self-pay | Admitting: Behavioral Health

## 2023-09-01 ENCOUNTER — Ambulatory Visit (INDEPENDENT_AMBULATORY_CARE_PROVIDER_SITE_OTHER): Payer: No Typology Code available for payment source | Admitting: Behavioral Health

## 2023-09-01 DIAGNOSIS — F410 Panic disorder [episodic paroxysmal anxiety] without agoraphobia: Secondary | ICD-10-CM | POA: Diagnosis not present

## 2023-09-01 DIAGNOSIS — F331 Major depressive disorder, recurrent, moderate: Secondary | ICD-10-CM | POA: Diagnosis not present

## 2023-09-01 DIAGNOSIS — F411 Generalized anxiety disorder: Secondary | ICD-10-CM

## 2023-09-01 MED ORDER — ESCITALOPRAM OXALATE 20 MG PO TABS: 20.0000 mg | ORAL_TABLET | Freq: Every day | ORAL | 1 refills | Status: DC

## 2023-09-01 MED ORDER — HYDROXYZINE HCL 10 MG PO TABS: 10.0000 mg | ORAL_TABLET | Freq: Three times a day (TID) | ORAL | 3 refills | Status: DC | PRN

## 2023-09-01 NOTE — Progress Notes (Signed)
 [facility]  Patient ID: Jeremiah Bentley,  MRN: [id number]  PCP: Ronnald Nian, MD  Date of Evaluation: 09/01/2023 Time spent:30 minutes  Chief Complaint:  Chief Complaint   Anxiety; Depression; Follow-up; Medication Refill; Patient Education     HISTORY/CURRENT STATUS: HPI "Jeremiah Bentley", 38 year old male presents to this office for follow up and medication management.  Still doing well on meds. Situational anxiety when prior to picking son up for visitation. He worries about the "unknown stress" with what the mother of his son will bring.  For now he sees son every weekend for 2 hours. He would like to have more time. He is concerned about his son being neglected with schooling. Says he is not sure his son is being home schooled adequately and is very concerned. Also seeing social deficits.  Still dealing with attorneys as appropriate. He is happy with his medication right now and does not want to make any changes.  He says that his anxiety today is 4/10, and his depression today is 0/10.  Says that he is sleeping 7-8 hours every night.  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 He denies any history of mania, no psychosis, no auditory or visual hallucinations.  No SI or HI.   Past psychotropic medication trials: Zoloft Celexa Individual Medical History/ Review of Systems: Changes? :No   Allergies: Patient has no known allergies.  Current Medications:  Current Outpatient Medications:    acetaminophen (TYLENOL) 500 MG tablet, Take 1,000 mg by mouth every 6 (six) hours as needed. (Patient not taking: Reported on 03/10/2023), Disp: , Rfl:    ALPRAZolam (XANAX) 0.5 MG tablet, Take 1 tablet (0.5 mg total) by mouth 2 (two) times daily as needed for anxiety. (Patient not taking: Reported on 03/10/2023), Disp: 20 tablet, Rfl: 0   escitalopram (LEXAPRO) 20 MG tablet, Take 1 tablet (20 mg total) by  mouth daily., Disp: 90 tablet, Rfl: 1   hydrOXYzine (ATARAX) 10 MG tablet, Take 1 tablet (10 mg total) by mouth 3 (three) times daily as needed., Disp: 90 tablet, Rfl: 3   MAGNESIUM GLYCINATE PO, Take 500 mg by mouth daily., Disp: , Rfl:  Medication Side Effects: none  Family Medical/ Social History: Changes? No  MENTAL HEALTH EXAM:  There were no vitals taken for this visit.There is no height or weight on file to calculate BMI.  General Appearance: Casual, Neat, and Well Groomed  Eye Contact:  Good  Speech:  Clear and Coherent  Volume:  Normal  Mood:  NA  Affect:  Appropriate  Thought Process:  Coherent  Orientation:  Full (Time, Place, and Person)  Thought Content: Logical   Suicidal Thoughts:  No  Homicidal Thoughts:  No  Memory:  WNL  Judgement:  Good  Insight:  Good  Psychomotor Activity:  Normal  Concentration:  Concentration: Good  Recall:  Good  Fund of Knowledge: Good  Language: Good  Assets:  Desire for Improvement  ADL's:  Intact  Cognition: WNL  Prognosis:  Good    DIAGNOSES:    ICD-10-CM   1. Generalized anxiety disorder  F41.1 hydrOXYzine (ATARAX) 10 MG tablet    escitalopram (LEXAPRO) 20 MG tablet    2. Panic attacks  F41.0 hydrOXYzine (ATARAX) 10 MG tablet    escitalopram (LEXAPRO) 20 MG tablet    3. Major depressive disorder, recurrent episode, moderate (HCC)  F33.1 escitalopram (LEXAPRO) 20 MG tablet  Receiving Psychotherapy: No    RECOMMENDATIONS:  Greater than 50% of  30 min face to face time with patient was spent on counseling and coordination of care. Still doing well with Lexapro.  Sleep has improved. Discussed dynamics and social changes pertaining to partial custody of son. More legal decisions to come. He is coping well for now. Request no medication changes this visit.   We agreed to:   To continue Lexapro to 20  mg daily To continue hydroxyzine 25 mg three times daily as needed  Will contact this office for worsening symptoms  or side effects Provided emergency contact information To follow-up in 12 weeks to reassess Discussed potential benefits, risk, and side effects of benzodiazepines to include potential risk of tolerance and dependence, as well as possible drowsiness.  Advised patient not to drive if experiencing drowsiness and to take lowest possible effective dose to minimize risk of dependence and tolerance.  Reviewed PDMP    Joan Flores, NP

## 2023-09-15 ENCOUNTER — Ambulatory Visit: Admitting: Medical

## 2023-09-15 VITALS — BP 110/70 | HR 76 | Wt 223.2 lb

## 2023-09-15 DIAGNOSIS — S161XXD Strain of muscle, fascia and tendon at neck level, subsequent encounter: Secondary | ICD-10-CM

## 2023-09-15 DIAGNOSIS — M62838 Other muscle spasm: Secondary | ICD-10-CM

## 2023-09-15 NOTE — Progress Notes (Signed)
 Subjective:  Jeremiah Bentley is a 38 y.o. male who presents for Chief Complaint  Patient presents with   second opinion    Second opinion on spine for upper and back pain. Went to [facility], having trouble moving neck. Had xray done yesterday.      Here to discuss concerns about his spine and back.  He is seeing chiropractor lately.  Symptoms are chronic from high school.   He notes over the years he would just deal with injuries, has prior stinger injuries in football, and has always had some upper back pains, stabbing pains periodic.   Mowing yard or long trips aggravates his pains in upper back.  Never had neck locked up like it did yesterday.   He saw chiropractor yesterday and today for first time.   Yesterday was in a lot of pain  Xray was taken and they noted extra lumbar spinal body, and they noted decreased curvature of neck, possible herniated disc in neck.  Yesterday couldn't move neck, but today neck has more ROM.  Yesterday chiropractor did some manipulations.    He does note some numbness down right amr into fingers the last few days.  Those symptoms are improved today.   They wanted him to come back for several visit.    Saw [employer].    No other aggravating or relieving factors.    No other c/o.  Past Medical History:  Diagnosis Date   Smoker    Current Outpatient Medications on File Prior to Visit  Medication Sig Dispense Refill   acetaminophen (TYLENOL) 500 MG tablet Take 1,000 mg by mouth every 6 (six) hours as needed.     ALPRAZolam (XANAX) 0.5 MG tablet Take 1 tablet (0.5 mg total) by mouth 2 (two) times daily as needed for anxiety. 20 tablet 0   escitalopram (LEXAPRO) 20 MG tablet Take 1 tablet (20 mg total) by mouth daily. 90 tablet 1   hydrOXYzine (ATARAX) 10 MG tablet Take 1 tablet (10 mg total) by mouth 3 (three) times daily as needed. 90 tablet 3   MAGNESIUM GLYCINATE PO Take 500 mg by mouth daily.     No current  facility-administered medications on file prior to visit.   No past surgical history on file.   The following portions of the patient's history were reviewed and updated as appropriate: allergies, current medications, past family history, past medical history, past social history, past surgical history and problem list.  ROS Otherwise as in subjective above    Objective: BP 110/70   Pulse 76   Wt 223 lb 3.2 oz (101.2 kg)   BMI 32.96 kg/m   General appearance: alert, no distress, well developed, well nourished Mild tenderness right lateral neck, right rotational range of motion of neck about 90% of normal, rest of range of motion relatively normal, no mass or lymphadenopathy or thyromegaly Mild tenderness right upper back paraspinal region and trapezius region, otherwise back nontender with normal range of motion Arms nontender with normal range of motion, no deformity Arms neurovascularly intact   Assessment: Encounter Diagnoses  Name Primary?   Neck muscle spasm Yes   Neck strain, subsequent encounter      Plan: I reviewed the paper copy of an x-ray he brought in from chiropractor for his neck.  No loss of normal lordosis but no major arthritic changes, good anterior alignment, questionable fused lumbar vertebrae but unable to fully appreciate on the paper copy  Fortunately he already sees significant improvement  in his neck spasm and range of motion and just the last day or 2 after seeing chiropractor and doing some TENS unit.  Advised her that she days use stretching, heat, massage, TENS unit, use over-the-counter anti-inflammatory such as Aleve twice a day for a few days.  He declines muscle laxer.  I recommend he do a couple more visits with chiropractor but not necessarily visits for the next 6 weeks unless he is continue to have neck pains.  Overall much improved in the last 24 hours  Jeremiah [name] was seen today for second opinion.  Diagnoses and all orders  for this visit:  Neck muscle spasm  Neck strain, subsequent encounter    Follow up: prn

## 2023-10-24 ENCOUNTER — Other Ambulatory Visit: Payer: Self-pay | Admitting: Behavioral Health

## 2023-10-24 DIAGNOSIS — F410 Panic disorder [episodic paroxysmal anxiety] without agoraphobia: Secondary | ICD-10-CM

## 2023-10-24 DIAGNOSIS — F411 Generalized anxiety disorder: Secondary | ICD-10-CM

## 2023-10-25 NOTE — Telephone Encounter (Signed)
 Verify dose - 25 mg or 10 mg

## 2023-10-26 NOTE — Telephone Encounter (Signed)
 To continue hydroxyzine  25 mg three times daily as needed    Verify dose.

## 2023-10-27 NOTE — Telephone Encounter (Signed)
 Patient reporting he does not take daily, he has 2 bottles now, and doesn't need a RF. Will refuse.

## 2023-11-25 ENCOUNTER — Ambulatory Visit: Payer: 59 | Admitting: Behavioral Health

## 2023-12-04 ENCOUNTER — Encounter: Payer: Self-pay | Admitting: Behavioral Health

## 2023-12-04 ENCOUNTER — Ambulatory Visit (INDEPENDENT_AMBULATORY_CARE_PROVIDER_SITE_OTHER): Admitting: Behavioral Health

## 2023-12-04 DIAGNOSIS — F410 Panic disorder [episodic paroxysmal anxiety] without agoraphobia: Secondary | ICD-10-CM | POA: Diagnosis not present

## 2023-12-04 DIAGNOSIS — F411 Generalized anxiety disorder: Secondary | ICD-10-CM | POA: Diagnosis not present

## 2023-12-04 DIAGNOSIS — F331 Major depressive disorder, recurrent, moderate: Secondary | ICD-10-CM

## 2023-12-04 MED ORDER — ESCITALOPRAM OXALATE 20 MG PO TABS: 20.0000 mg | ORAL_TABLET | Freq: Every day | ORAL | 1 refills | Status: DC

## 2023-12-04 NOTE — Progress Notes (Signed)
 [facility]  Patient ID: Jeremiah Bentley,  MRN: [id number]  PCP: Watson Hacking, MD  Date of Evaluation: 12/04/2023 Time spent:30 minutes  Chief Complaint:  Chief Complaint   Anxiety; Depression; Follow-up; Medication Refill; Patient Education; Stress     HISTORY/CURRENT STATUS: HPI "Jeremiah Bentley", 38 year old male presents to this office for follow up and medication management.  Still doing well on meds. Situational anxiety when prior to picking son up for visitation. Still dealing with legal battle in court for custody of son. Still dealing with attorneys as appropriate. He is happy with his medication right now and does not want to make any changes.  He says that his anxiety today is 3/10, and his depression today is 0/10.  Says that he is sleeping 7-8 hours every night.  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 He denies any history of mania, no psychosis, no auditory or visual hallucinations.  No SI or HI.   Past psychotropic medication trials: Zoloft  Celexa  Individual Medical History/ Review of Systems: Changes? :No   Allergies: Patient has no known allergies.  Current Medications:  Current Outpatient Medications:    acetaminophen  (TYLENOL ) 500 MG tablet, Take 1,000 mg by mouth every 6 (six) hours as needed., Disp: , Rfl:    ALPRAZolam  (XANAX ) 0.5 MG tablet, Take 1 tablet (0.5 mg total) by mouth 2 (two) times daily as needed for anxiety., Disp: 20 tablet, Rfl: 0   escitalopram  (LEXAPRO ) 20 MG tablet, Take 1 tablet (20 mg total) by mouth daily., Disp: 90 tablet, Rfl: 1   hydrOXYzine  (ATARAX ) 10 MG tablet, Take 1 tablet (10 mg total) by mouth 3 (three) times daily as needed., Disp: 90 tablet, Rfl: 3   MAGNESIUM GLYCINATE PO, Take 500 mg by mouth daily., Disp: , Rfl:  Medication Side Effects: none  Family Medical/ Social History: Changes? No  MENTAL HEALTH EXAM:  There were no  vitals taken for this visit.There is no height or weight on file to calculate BMI.  General Appearance: Casual, Neat, and Well Groomed  Eye Contact:  Good  Speech:  Clear and Coherent  Volume:  Normal  Mood:  NA  Affect:  Appropriate  Thought Process:  Coherent  Orientation:  Full (Time, Place, and Person)  Thought Content: Logical   Suicidal Thoughts:  No  Homicidal Thoughts:  No  Memory:  WNL  Judgement:  Good  Insight:  Good  Psychomotor Activity:  Normal  Concentration:  Concentration: Good  Recall:  Good  Fund of Knowledge: Good  Language: Good  Assets:  Desire for Improvement  ADL's:  Intact  Cognition: WNL  Prognosis:  Good    DIAGNOSES:    ICD-10-CM   1. Generalized anxiety disorder  F41.1 escitalopram  (LEXAPRO ) 20 MG tablet    2. Panic attacks  F41.0 escitalopram  (LEXAPRO ) 20 MG tablet    3. Major depressive disorder, recurrent episode, moderate (HCC)  F33.1 escitalopram  (LEXAPRO ) 20 MG tablet      Receiving Psychotherapy: No    RECOMMENDATIONS:  Greater than 50% of  30 min face to face time with patient was spent on counseling and coordination of care.  No social changes this. Still doing well with Lexapro .  Sleep has improved. Discussed dynamics and social changes pertaining to partial custody of son. More legal decisions to come. He is coping well for now. Request no medication changes this visit.    We agreed to:  To continue Lexapro  to 20  mg daily To continue hydroxyzine  25 mg three times daily as needed  Will contact this office for worsening symptoms or side effects Provided emergency contact information To follow-up in 12 weeks to reassess Discussed potential benefits, risk, and side effects of benzodiazepines to include potential risk of tolerance and dependence, as well as possible drowsiness.  Advised patient not to drive if experiencing drowsiness and to take lowest possible effective dose to minimize risk of dependence and tolerance.  Reviewed  PDMP   Lincoln Renshaw, NP

## 2024-01-06 ENCOUNTER — Encounter: Payer: Self-pay | Admitting: Family Medicine

## 2024-01-06 ENCOUNTER — Ambulatory Visit (INDEPENDENT_AMBULATORY_CARE_PROVIDER_SITE_OTHER): Admitting: Family Medicine

## 2024-01-06 VITALS — BP 122/84 | HR 78 | Wt 218.4 lb

## 2024-01-06 DIAGNOSIS — K2101 Gastro-esophageal reflux disease with esophagitis, with bleeding: Secondary | ICD-10-CM | POA: Diagnosis not present

## 2024-01-06 NOTE — Progress Notes (Signed)
   Subjective:    Patient ID: Jeremiah Bentley, male    DOB: 10-29-1985, 38 y.o.   MRN: [id number]  HPI He is here for evaluation of continued difficulty with reflux symptoms.  He has been using Pepcid and does get a benefit out of it but sometimes forgets to take it.  He states that his reflux symptoms especially chest discomfort gets worse.  He states that he can eat and then sometimes an hour later will have chest discomfort.  Recently he has seen a couple of episodes of blood that he states is coming from his stomach area.  He has also had some bleeding when he brushes his teeth and I explained that this is not the issue.   Review of Systems     Objective:    Physical Exam Alert and in no distress.  Cardiac exam shows regular rhythm without murmurs or gallops.  Lungs are clear to auscultation.       Assessment & Plan:  Gastroesophageal reflux disease with esophagitis and hemorrhage - Plan: Ambulatory referral to Gastroenterology The symptoms are suggestive of hemorrhage from the reflux although it was difficult to get a good history from him.  Referral was made and he is to use 40 mg of Prilosec daily to see if this will help relieve his symptoms prior to being seen.  Explained that it could take a while before he get appointment.  And then mention something about his feet and I explained that we need more time to evaluate that and have him make an appointment.

## 2024-01-06 NOTE — Patient Instructions (Signed)
 Start taking 2 Prilosec daily until you get seen by the GI doctor

## 2024-01-07 ENCOUNTER — Ambulatory Visit: Admitting: Family Medicine

## 2024-03-04 ENCOUNTER — Encounter: Payer: Self-pay | Admitting: Physician Assistant

## 2024-04-05 ENCOUNTER — Ambulatory Visit (INDEPENDENT_AMBULATORY_CARE_PROVIDER_SITE_OTHER): Admitting: Behavioral Health

## 2024-04-05 DIAGNOSIS — F411 Generalized anxiety disorder: Secondary | ICD-10-CM

## 2024-04-05 DIAGNOSIS — F331 Major depressive disorder, recurrent, moderate: Secondary | ICD-10-CM

## 2024-04-05 DIAGNOSIS — F410 Panic disorder [episodic paroxysmal anxiety] without agoraphobia: Secondary | ICD-10-CM

## 2024-04-05 MED ORDER — ESCITALOPRAM OXALATE 20 MG PO TABS: 20.0000 mg | ORAL_TABLET | Freq: Every day | ORAL | 1 refills | Status: DC

## 2024-04-05 NOTE — Progress Notes (Signed)
 [facility]  Patient ID: Jeremiah Bentley,  MRN: [id number]  PCP: Joyce Norleen BROCKS, MD  Date of Evaluation: 04/05/2024 Time spent:20 minutes  Chief Complaint:  Chief Complaint   Anxiety; Depression; Family Problem; Follow-up; Medication Refill; Patient Education     HISTORY/CURRENT STATUS: HPI Jeremiah Bentley, 38 year old male presents to this office for follow up and medication management.  Still doing well on meds. Would like to start weaning off his  medication this visit. Says that he feels very stable. Will be moving in with his fiance soon and believes that will help him with support network.  Still some mild situational anxiety when prior to picking son up for visitation. Still dealing with legal battle in court for custody of son.  He says that his anxiety today is 2/10, and his depression today is 0/10.  Says that he is sleeping 7-8 hours every night.  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 He denies any history of mania, no psychosis, no auditory or visual hallucinations.  No SI or HI.   Past psychotropic medication trials: Zoloft  Celexa      Individual Medical History/ Review of Systems: Changes? :No   Allergies: Patient has no known allergies.  Current Medications:  Current Outpatient Medications:    acetaminophen  (TYLENOL ) 500 MG tablet, Take 1,000 mg by mouth every 6 (six) hours as needed., Disp: , Rfl:    ALPRAZolam  (XANAX ) 0.5 MG tablet, Take 1 tablet (0.5 mg total) by mouth 2 (two) times daily as needed for anxiety. (Patient not taking: Reported on 01/06/2024), Disp: 20 tablet, Rfl: 0   escitalopram  (LEXAPRO ) 20 MG tablet, Take 1 tablet (20 mg total) by mouth daily., Disp: 90 tablet, Rfl: 1   hydrOXYzine  (ATARAX ) 10 MG tablet, Take 1 tablet (10 mg total) by mouth 3 (three) times daily as needed. (Patient not taking: Reported on 01/06/2024), Disp: 90 tablet, Rfl: 3    MAGNESIUM GLYCINATE PO, Take 500 mg by mouth daily. (Patient not taking: Reported on 01/06/2024), Disp: , Rfl:  Medication Side Effects: none  Family Medical/ Social History: Changes? No  MENTAL HEALTH EXAM:  There were no vitals taken for this visit.There is no height or weight on file to calculate BMI.  General Appearance: Casual, Neat, and Well Groomed  Eye Contact:  Good  Speech:  Clear and Coherent  Volume:  Normal  Mood:  NA  Affect:  Appropriate  Thought Process:  Coherent  Orientation:  Full (Time, Place, and Person)  Thought Content: Logical   Suicidal Thoughts:  No  Homicidal Thoughts:  No  Memory:  WNL  Judgement:  Good  Insight:  Good  Psychomotor Activity:  Normal  Concentration:  Concentration: Good  Recall:  Good  Fund of Knowledge: Good  Language: Good  Assets:  Desire for Improvement  ADL's:  Intact  Cognition: WNL  Prognosis:  Good    DIAGNOSES:    ICD-10-CM   1. Generalized anxiety disorder  F41.1 escitalopram  (LEXAPRO ) 20 MG tablet    2. Panic attacks  F41.0 escitalopram  (LEXAPRO ) 20 MG tablet    3. Major depressive disorder, recurrent episode, moderate (HCC)  F33.1 escitalopram  (LEXAPRO ) 20 MG tablet      Receiving Psychotherapy: No    RECOMMENDATIONS:    Greater than 50% of  30 min face to face time with patient was spent on counseling and coordination of care.  No social changes this. Still doing well  with Lexapro . Would like to discuss weaning off his medication today. Recommended he reduce Lexapro  to 10 mg until next visit and will reassess how he is feeling.  Sleep has improved. Discussed dynamics and social changes pertaining to partial custody of son. More legal decisions to come. He is coping well for now. Request no medication changes this visit.    We agreed to:   To reduce Lexapro  to 10  mg daily To continue hydroxyzine  25 mg three times daily as needed  Will contact this office for worsening symptoms or side effects Provided  emergency contact information To follow-up in 12 weeks to reassess Discussed potential benefits, risk, and side effects of benzodiazepines to include potential risk of tolerance and dependence, as well as possible drowsiness.  Advised patient not to drive if experiencing drowsiness and to take lowest possible effective dose to minimize risk of dependence and tolerance.  Reviewed PDMP         Redell DELENA Pizza, NP

## 2024-04-13 ENCOUNTER — Encounter: Payer: Self-pay | Admitting: Family Medicine

## 2024-04-13 ENCOUNTER — Ambulatory Visit: Payer: 59 | Admitting: Family Medicine

## 2024-04-13 VITALS — BP 130/82 | HR 71 | Ht 69.75 in | Wt 221.4 lb

## 2024-04-13 DIAGNOSIS — E669 Obesity, unspecified: Secondary | ICD-10-CM | POA: Diagnosis not present

## 2024-04-13 DIAGNOSIS — M722 Plantar fascial fibromatosis: Secondary | ICD-10-CM

## 2024-04-13 DIAGNOSIS — R5383 Other fatigue: Secondary | ICD-10-CM | POA: Diagnosis not present

## 2024-04-13 DIAGNOSIS — F411 Generalized anxiety disorder: Secondary | ICD-10-CM | POA: Diagnosis not present

## 2024-04-13 DIAGNOSIS — R0683 Snoring: Secondary | ICD-10-CM

## 2024-04-13 DIAGNOSIS — Z0000 Encounter for general adult medical examination without abnormal findings: Secondary | ICD-10-CM | POA: Diagnosis not present

## 2024-04-13 DIAGNOSIS — E7800 Pure hypercholesterolemia, unspecified: Secondary | ICD-10-CM | POA: Diagnosis not present

## 2024-04-13 LAB — LIPID PANEL

## 2024-04-13 MED ORDER — MELOXICAM 15 MG PO TABS: 15.0000 mg | ORAL_TABLET | Freq: Every day | ORAL | 0 refills | Status: AC

## 2024-04-13 NOTE — Progress Notes (Signed)
 Name: Jeremiah Bentley   Date of Visit: 04/13/24   Date of last visit with me: Visit date not found   CHIEF COMPLAINT:  Chief Complaint  Patient presents with   Annual Exam    CPE fasting labs, foot pain mainly on rt. Foot hard to walk on and tired all the time.        HPI:  Discussed the use of AI scribe software for clinical note transcription with the patient, who gave verbal consent to proceed.  History of Present Illness   Jeremiah Bentley is a 38 year old male who presents with fatigue and foot pain.  He experiences persistent fatigue, which he attributes to his current attempt to taper off Lexapro . He began reducing the medication last week by alternating between 20 mg and 10 mg doses, resulting in feelings of malaise. He has gained approximately 30 pounds since starting Lexapro  and has struggled to lose weight despite dietary adjustments. He plans to improve his diet and exercise routine following a recent move. He snores at night, as noted by his fiance, and suspects it may be related to his weight. He also experiences difficulty with ejaculation, which he believes is linked to Lexapro  use.  He has persistent foot pain, particularly in the morning. The pain is located in the arch area and worsens when walking barefoot on hardwood floors. His occupation requires standing on concrete surfaces all day. He recently purchased new shoes that alleviate the pain when worn.  He recently moved in with his fiance and is in the process of selling his house. He is also involved in a custody case that has been ongoing for about three years.         OBJECTIVE:       04/13/2024    8:10 AM  Depression screen PHQ 2/9  Decreased Interest 0  Down, Depressed, Hopeless 0  PHQ - 2 Score 0     BP Readings from Last 3 Encounters:  04/13/24 130/82  01/06/24 122/84  09/15/23 110/70    BP 130/82   Pulse 71   Ht 5' 9.75 (1.772 m)   Wt 221 lb 6.4 oz (100.4 kg)   BMI 32.00  kg/m    Physical Exam   EXTREMITIES: Pain in the plantar aspect of the foot, worse in the morning, consistent with plantar fasciitis.      Physical Exam Constitutional:      Appearance: Normal appearance.  Neurological:     General: No focal deficit present.     Mental Status: He is alert and oriented to person, place, and time. Mental status is at baseline.   Inspection of the bilateral foot reveals some mild pes planus with the left being more pronounced than the right.  There is tenderness to palpation at the midportion of the plantar fascia.  ASSESSMENT/PLAN:   Assessment & Plan Annual physical exam  Obesity (BMI 30-39.9)  Hypercholesteremia  Generalized anxiety disorder  Fatigue, unspecified type  Snoring  Plantar fasciitis, bilateral    Assessment and Plan     Annual Physical -Comprehensive annual physical exam completed today. Reviewed interval history, current medical issues, medications, allergies, and preventive care needs. Addressed all patient questions and concerns. Discussed lifestyle factors including diet, exercise, sleep, and stress management. Reviewed recommended age-appropriate screenings, labs, and vaccinations. Counseling provided on healthy habits and routine health maintenance. Follow-up as indicated based on findings and results. - Patient declined COVID and flu vaccine  Plantar fasciitis, bilateral feet  Chronic plantar fasciitis since June, worse in the morning and with barefoot walking on hard surfaces. Occupational standing on concrete exacerbates condition. - Prescribed meloxicam  for 10 days with food once daily. - Referred to sports medicine clinic for custom orthotic fitting. - Advised against ibuprofen or Aleve  while on meloxicam .  Fatigue Persistent fatigue possibly linked to medication tapering and lifestyle. Vitamin D deficiency considered. - Ordered routine blood work including vitamin D level.  Hypertension Blood pressure  averaging 130/80-85, likely weight-related. No medication changes to antihypertensives due to Lexapro  tapering. - Reassess blood pressure in 3 months. - Encouraged weight loss through lifestyle modifications.  Hyperlipidemia Previous high cholesterol, monitoring for changes. Weight and lifestyle changes expected to improve lipid profile. - Ordered basic labs to reassess cholesterol levels.  Obesity Weight gain of 30 pounds since Lexapro  initiation, now stable. Lifestyle changes and Lexapro  tapering expected to aid weight reduction. - Encouraged resumption of gym routine and healthy eating habits. - Monitor weight changes post-Lexapro  tapering.  Suspected weight-related sleep apnea Snoring and fatigue suggest possible obstructive sleep apnea, likely weight-related. No immediate intervention planned. - Reassess symptoms after weight loss. - Consider further evaluation if symptoms persist.  Major depressive disorder, single episode, in remission, tapering Lexapro  Tapering off Lexapro  with gradual reduction. Experiencing some withdrawal symptoms during taper. No Xanax  or hydroxyzine  use. - Continue Lexapro  tapering with a gradual reduction plan. - Removed Xanax  and hydroxyzine  from medication list.         Jeremiah Sheller A. Vita MD [facility]

## 2024-04-14 ENCOUNTER — Ambulatory Visit: Payer: Self-pay | Admitting: Family Medicine

## 2024-04-14 DIAGNOSIS — E559 Vitamin D deficiency, unspecified: Secondary | ICD-10-CM

## 2024-04-14 LAB — CBC WITH DIFFERENTIAL/PLATELET
Basophils Absolute: 0.1 x10E3/uL (ref 0.0–0.2)
Basos: 2 %
EOS (ABSOLUTE): 0.3 x10E3/uL (ref 0.0–0.4)
Eos: 5 %
Hematocrit: 46.7 % (ref 37.5–51.0)
Hemoglobin: 15.9 g/dL (ref 13.0–17.7)
Immature Grans (Abs): 0 x10E3/uL (ref 0.0–0.1)
Immature Granulocytes: 0 %
Lymphocytes Absolute: 2.9 x10E3/uL (ref 0.7–3.1)
Lymphs: 42 %
MCH: 30.2 pg (ref 26.6–33.0)
MCHC: 34 g/dL (ref 31.5–35.7)
MCV: 89 fL (ref 79–97)
Monocytes Absolute: 0.5 x10E3/uL (ref 0.1–0.9)
Monocytes: 7 %
Neutrophils Absolute: 3.1 x10E3/uL (ref 1.4–7.0)
Neutrophils: 44 %
Platelets: 279 x10E3/uL (ref 150–450)
RBC: 5.27 x10E6/uL (ref 4.14–5.80)
RDW: 12.2 % (ref 11.6–15.4)
WBC: 6.9 x10E3/uL (ref 3.4–10.8)

## 2024-04-14 LAB — LIPID PANEL
Cholesterol, Total: 211 mg/dL — AB (ref 100–199)
HDL: 27 mg/dL — AB (ref >39)
LDL CALC COMMENT:: 7.8 ratio — AB (ref 0.0–5.0)
LDL Chol Calc (NIH): 152 mg/dL — AB (ref 0–99)
Triglycerides: 172 mg/dL — ABNORMAL HIGH (ref 0–149)
VLDL Cholesterol Cal: 32 mg/dL (ref 5–40)

## 2024-04-14 LAB — COMPREHENSIVE METABOLIC PANEL WITH GFR
ALT: 23 IU/L (ref 0–44)
AST: 16 IU/L (ref 0–40)
Albumin: 4.2 g/dL (ref 4.1–5.1)
Alkaline Phosphatase: 55 IU/L (ref 47–123)
BUN/Creatinine Ratio: 12 (ref 9–20)
BUN: 13 mg/dL (ref 6–20)
Bilirubin Total: 0.3 mg/dL (ref 0.0–1.2)
CO2: 20 mmol/L (ref 20–29)
Calcium: 9.4 mg/dL (ref 8.7–10.2)
Chloride: 106 mmol/L (ref 96–106)
Creatinine, Ser: 1.08 mg/dL (ref 0.76–1.27)
Globulin, Total: 2.3 g/dL (ref 1.5–4.5)
Glucose: 94 mg/dL (ref 70–99)
Potassium: 4.3 mmol/L (ref 3.5–5.2)
Sodium: 140 mmol/L (ref 134–144)
Total Protein: 6.5 g/dL (ref 6.0–8.5)
eGFR: 91 mL/min/1.73 (ref >59)

## 2024-04-14 LAB — VITAMIN D 25 HYDROXY (VIT D DEFICIENCY, FRACTURES): Vit D, 25-Hydroxy: 24.4 ng/mL — AB (ref 30.0–100.0)

## 2024-04-14 MED ORDER — VITAMIN D (ERGOCALCIFEROL) 1.25 MG (50000 UNIT) PO CAPS: 50000.0000 [IU] | ORAL_CAPSULE | ORAL | 0 refills | Status: DC

## 2024-04-14 NOTE — Progress Notes (Signed)
 Called Patient and gave results, and let him know about the Vit D. He understands.

## 2024-04-18 ENCOUNTER — Ambulatory Visit (INDEPENDENT_AMBULATORY_CARE_PROVIDER_SITE_OTHER): Admitting: Family Medicine

## 2024-04-18 VITALS — BP 112/76 | Ht 68.0 in | Wt 215.0 lb

## 2024-04-18 DIAGNOSIS — Q6672 Congenital pes cavus, left foot: Secondary | ICD-10-CM

## 2024-04-18 DIAGNOSIS — Q6671 Congenital pes cavus, right foot: Secondary | ICD-10-CM

## 2024-04-18 DIAGNOSIS — M722 Plantar fascial fibromatosis: Secondary | ICD-10-CM

## 2024-04-18 NOTE — Progress Notes (Signed)
 PCP: Jeremiah Morrow, MD  Subjective:   HPI: Patient is a 38 y.o. male here for custom orthotics.  Patient referred by Dr. Vita for his plantar fasciitis for custom orthotics. Has had pain since June. On feet all day and on concrete. Started taking meloxicam .  Past Medical History:  Diagnosis Date   Smoker     Current Outpatient Medications on File Prior to Visit  Medication Sig Dispense Refill   acetaminophen  (TYLENOL ) 500 MG tablet Take 1,000 mg by mouth every 6 (six) hours as needed.     escitalopram  (LEXAPRO ) 20 MG tablet Take 1 tablet (20 mg total) by mouth daily. (Patient taking differently: Take 20 mg by mouth daily. Trying to wing off of it now doing 20 one day and 10 next day) 90 tablet 1   MAGNESIUM GLYCINATE PO Take 500 mg by mouth daily.     meloxicam  (MOBIC ) 15 MG tablet Take 1 tablet (15 mg total) by mouth daily. 30 tablet 0   Vitamin D, Ergocalciferol, (DRISDOL) 1.25 MG (50000 UNIT) CAPS capsule Take 1 capsule (50,000 Units total) by mouth every 7 (seven) days for 8 doses. Take 1 tablet by mouth ONCE A WEEK for 8 weeks. 8 capsule 0   No current facility-administered medications on file prior to visit.    No past surgical history on file.  No Known Allergies  BP 112/76   Ht 5' 8 (1.727 m)   Wt 215 lb (97.5 kg)   BMI 32.69 kg/m       No data to display              No data to display              Objective:  Physical Exam:  Gen: NAD, comfortable in exam room  Bilateral feet/ankles: Pes cavus L > R.  No other gross deformity, swelling, ecchymoses.  No transverse arch collapse.  Minimal hallux rigidus Full range of motion Tenderness to palpation R > L anteromedial calcaneus at plantar fascia insertion Negative calcaneal squeeze bilaterally. NV intact distally.    Assessment & Plan:  1. Bilateral plantar fasciitis - home exercises, custom orthotics made today as below and felt comfortable.  Follow up with Dr. Vita for further  treatment/follow-up.  Patient was fitted for a : standard, cushioned, semi-rigid orthotic. The orthotic was heated and afterward the patient stood on the orthotic blank positioned on the orthotic stand. The patient was positioned in subtalar neutral position and 10 degrees of ankle dorsiflexion in a weight bearing stance. After completion of molding, a stable base was applied to the orthotic blank. The blank was ground to a stable position for weight bearing. Size: 11 fit & run Base: none Posting: none Additional orthotic padding: none

## 2024-04-19 ENCOUNTER — Ambulatory Visit (INDEPENDENT_AMBULATORY_CARE_PROVIDER_SITE_OTHER): Admitting: Family Medicine

## 2024-04-19 ENCOUNTER — Encounter: Payer: Self-pay | Admitting: Family Medicine

## 2024-04-19 VITALS — BP 132/72 | HR 68 | Ht 69.0 in | Wt 220.0 lb

## 2024-04-19 DIAGNOSIS — L255 Unspecified contact dermatitis due to plants, except food: Secondary | ICD-10-CM | POA: Diagnosis not present

## 2024-04-19 NOTE — Progress Notes (Signed)
   Subjective:    Patient ID: Jeremiah Bentley, male    DOB: 10-21-1985, 38 y.o.   MRN: [id number]  Discussed the use of AI scribe software for clinical note transcription with the patient, who gave verbal consent to proceed.  History of Present Illness   Jeremiah Bentley is a 38 year old male who presents with a rash suspected to be from poison ivy or poison oak exposure.  The rash began last Tuesday or Wednesday, shortly after his yearly physical exam. It initially appeared on his right wrist and has since spread to his arm, beard, chest, neck, and ankle. The rash is described as itchy and has been progressively appearing in new areas over the past week.  He has attempted to manage the symptoms by applying bleach, although he acknowledges this may not be appropriate. The rash is still actively spreading, with new lesions appearing on his neck and ankle as recently as yesterday. Hot water exacerbates the itching, while cold rags provide some relief.           Review of Systems     Objective:    Physical Exam Physical Exam   SKIN: Erythematous scattered lesions on right arm, chest, neck, and left foot.            Assessment & Plan:   Allergic contact dermatitis from poison ivy/oak exposure with lesions on multiple areas. Managed with topical treatment. Systemic treatment not required. - Apply OTC cortisone cream sparingly to affected areas. - Use cold compresses to alleviate pruritus. - Take diphenhydramine at night for pruritus.

## 2024-04-26 ENCOUNTER — Ambulatory Visit: Admitting: Family Medicine

## 2024-04-26 ENCOUNTER — Ambulatory Visit: Admitting: Physician Assistant

## 2024-05-02 ENCOUNTER — Ambulatory Visit: Admitting: Family Medicine

## 2024-05-02 ENCOUNTER — Ambulatory Visit: Payer: Self-pay | Admitting: Family Medicine

## 2024-05-02 ENCOUNTER — Ambulatory Visit
Admission: RE | Admit: 2024-05-02 | Discharge: 2024-05-02 | Disposition: A | Discharge status: Home / Self Care | Source: Ambulatory Visit | Attending: Family Medicine | Admitting: Family Medicine

## 2024-05-02 ENCOUNTER — Ambulatory Visit: Payer: Self-pay

## 2024-05-02 ENCOUNTER — Encounter: Payer: Self-pay | Admitting: Family Medicine

## 2024-05-02 VITALS — BP 112/72 | HR 68 | Temp 97.7°F | Ht 69.0 in | Wt 214.6 lb

## 2024-05-02 DIAGNOSIS — S61432A Puncture wound without foreign body of left hand, initial encounter: Secondary | ICD-10-CM

## 2024-05-02 MED ORDER — AMOXICILLIN-POT CLAVULANATE 875-125 MG PO TABS: 1.0000 | ORAL_TABLET | Freq: Two times a day (BID) | ORAL | 0 refills | Status: DC

## 2024-05-02 NOTE — Patient Instructions (Signed)
 Go to [address] [facility]) to get an xray of your hand. Start the antibiotics for prevention of infection. Take it twice daily for a week. Contact us  if you have issues tolerating it.  Get re-evaluated if you develop fever, increasing pain, swelling, redness, drainage from the wound (pus).  I would avoid a lot of direct pressure to that hand, to allow the damaged tissue to heal.  If your x-rays are abnormal, we will refer you to a hand doctor. If you have ongoing issues, we can also refer.

## 2024-05-02 NOTE — Telephone Encounter (Signed)
 FYI Only or Action Required?: FYI only for provider.  Patient was last seen in primary care on 04/19/2024 by Jeremiah Norleen BROCKS, MD.  Called Nurse Triage reporting Hand Injury.  Symptoms began today.  Interventions attempted: Other: Cleaned and wrapped wound.  Symptoms are: unchanged.  Triage Disposition: See PCP When Office is Open (Within 3 Days)  Patient/caregiver understands and will follow disposition?: Yes - appt for this morning.                      Copied from CRM [id number]. Topic: Clinical - Red Word Triage >> May 02, 2024  9:32 AM Jeremiah Bentley wrote: Red Word that prompted transfer to Nurse Triage: Stabbed self with screwdriver, unsure if he should go to us  or urgent care. Reason for Disposition  [1] Last tetanus shot > 5 years ago AND [2] DIRTY cut or scrape  Answer Assessment - Initial Assessment Questions 1. MECHANISM: How did the injury happen?     15 minutes ago - stabbed himself with screwdriver 2. ONSET: When did the injury happen? (e.g., minutes, hours ago)      15 minutes ago 3. APPEARANCE of INJURY: What does the injury look like?      1-1.5 inches 4. SEVERITY: Can you use your hand normally? Can you bend your fingers into a ball and then fully open them?     Hurts to move thumb 5. SIZE: For cuts, bruises, or swelling, ask: How large is it? (e.g., inches or centimeters; entire hand)      Small - but deep 6. PAIN: How bad is the pain? (Scale 0-10; or none, mild, moderate, severe)    Did not ask 7. TETANUS: For any breaks in the skin, ask: When was your last tetanus booster?     About 7 years 8. OTHER SYMPTOMS: Do you have any other symptoms?      no  Protocols used: Hand Injury-A-AH

## 2024-05-02 NOTE — Progress Notes (Signed)
 Chief Complaint  Patient presents with   Puncture Wound    Stabbed left hand with screwdriver this am while at work. He states he something also got lodged in his hand when he stabbed himself-he did manage to pull out whatever it was.    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  He felt like it went in over an inch.  A narrow piece of plastic molding had entered through the puncture, 1/2 inch in length, and he was able to remove this. Doesn't feel like there is any further foreign body, but notes a lot of pressure at the base of the thumb, especially when he extends the thumb back.  Hurts to touch at the base of the thumb.  After the injury, he cleaned the wound with soap and water, then used antiseptic spray, and used antibacterial ointment. Bleeding stopped fairly quickly.   Immunization History  Administered Date(s) Administered   Influenza Split 03/05/2010   PFIZER(Purple Top)SARS-COV-2 Vaccination 07/04/2020, 08/01/2020   Tdap 01/04/2020     PMH, PSH, SH reviewed  Panic attacks--doing well, weaning off lexapro . No reflux since doing carnivore diet  Outpatient Encounter Medications as of 05/02/2024  Medication Sig Note   aspirin-acetaminophen -caffeine (EXCEDRIN MIGRAINE) 250-250-65 MG tablet Take 2 tablets by mouth every 6 (six) hours as needed for headache. 05/02/2024: Takes 2 as needed   escitalopram  (LEXAPRO ) 20 MG tablet Take 1 tablet (20 mg total) by mouth daily.    meloxicam  (MOBIC ) 15 MG tablet Take 1 tablet (15 mg total) by mouth daily. 05/02/2024: Taking for foot, took one last night   [DISCONTINUED] acetaminophen  (TYLENOL ) 500 MG tablet Take 1,000 mg by mouth every 6 (six) hours as needed. 05/02/2024: As needed   MAGNESIUM GLYCINATE PO Take 500 mg by mouth daily. (Patient not taking: Reported on 05/02/2024)    Vitamin D, Ergocalciferol, (DRISDOL) 1.25 MG (50000 UNIT) CAPS capsule Take 1 capsule (50,000 Units total) by  mouth every 7 (seven) days for 8 doses. Take 1 tablet by mouth ONCE A WEEK for 8 weeks. (Patient not taking: Reported on 05/02/2024) 05/02/2024: Has not started   No facility-administered encounter medications on file as of 05/02/2024.   No Known Allergies  ROS: no f/c, URI symptoms. No numbness or tingling of fingertips.   Pressure at base of thumb No CP or shortness of breath. No panic attacks.    PHYSICAL EXAM:  BP 112/72   Pulse 68   Temp 97.7 F (36.5 C) (Tympanic)   Ht 5' 9 (1.753 m)   Wt 214 lb 9.6 oz (97.3 kg)   BMI 31.69 kg/m   Pleasant, well-appearing male in no distress L hand--there is a slightly irregular wound at the webspace between 1st and 2nd finger. There is no active bleeding. There is no bruising or significant STS.  He is tender at the base of the 2nd MCP, part of thenar eminence. He has 2+ pulses, brisk capillary refill. Normal sensation to light touch and temperature. Normal strength of hand, with specific focus on 1st and 2nd fingers.    ASSESSMENT/PLAN:  Puncture wound of left hand, foreign body presence unspecified, initial encounter - Skin opening very small, not bleeding. Not irrigated. Will cover with ABX x 7d. S/sx infection/concern reviewed w/pt. Td UTD - Plan: DG Hand Complete Left, amoxicillin -clavulanate (AUGMENTIN) 875-125 MG tablet   Injury occurred at work. He preferred to be seen here at the office today, as he was already  here, and use his insurance.   XR--no foreign body, fracture or other abnl noted. Incidental note of FB to index fingertip.

## 2024-05-04 ENCOUNTER — Other Ambulatory Visit: Payer: Self-pay | Admitting: Family Medicine

## 2024-05-04 DIAGNOSIS — E559 Vitamin D deficiency, unspecified: Secondary | ICD-10-CM

## 2024-05-31 ENCOUNTER — Encounter: Payer: Self-pay | Admitting: Gastroenterology

## 2024-05-31 ENCOUNTER — Ambulatory Visit: Admitting: Gastroenterology

## 2024-05-31 VITALS — BP 118/68 | HR 76 | Ht 69.0 in | Wt 211.0 lb

## 2024-05-31 DIAGNOSIS — K219 Gastro-esophageal reflux disease without esophagitis: Secondary | ICD-10-CM

## 2024-05-31 DIAGNOSIS — R194 Change in bowel habit: Secondary | ICD-10-CM

## 2024-05-31 NOTE — Progress Notes (Signed)
 Chief Complaint:GERD Primary GI Doctor: Dr. Charlanne  HPI:  Patient is a  38  year old male patient with past medical history of GERD, anxiety, and vitamin D  deficiency, who was referred to me by Joyce Norleen BROCKS, MD on 01/06/24 for a evaluation of GERD.    Interval History Patient presents for evaluation of GERD. He reports he was having a lot of pyrosis and regurgitation about 4-5 months ago. He reports at times he felt something in his chest pushing back up into his throat.  He has changed his diet since then to carnivore diet with intermittent fasting and lost about 9lbs. He reports this has helped with his symptoms. He only had one episode with pizza, but otherwise feeling better.  He initially was taking OTC Tums daily, then told by his PCP to switch to Prilosec OTC which didn't help. He then increased it to twice daily which helped. He stopped it about a month ago when his symptoms improved.   Patient denies nausea, vomiting, or weight loss.  Patient denies abdominal pain, rectal bleeding. Has occasional constipation with carnivore diet. Some bloating with alcohol.  Vapes nicotine. Socially drinks.  Surgical history: none   Patient's family history includes: both parents with GERD, father with non hodgkin's lymphoma  Wt Readings from Last 3 Encounters:  05/31/24 211 lb (95.7 kg)  05/02/24 214 lb 9.6 oz (97.3 kg)  04/19/24 220 lb (99.8 kg)    Past Medical History:  Diagnosis Date   Smoker     History reviewed. No pertinent surgical history.  Current Outpatient Medications  Medication Sig Dispense Refill   aspirin-acetaminophen -caffeine (EXCEDRIN MIGRAINE) 250-250-65 MG tablet Take 2 tablets by mouth every 6 (six) hours as needed for headache.     meloxicam  (MOBIC ) 15 MG tablet Take 1 tablet (15 mg total) by mouth daily. 30 tablet 0   Vitamin D , Ergocalciferol , (DRISDOL ) 1.25 MG (50000 UNIT) CAPS capsule TAKE 1 CAPSULE BY MOUTH EVERY 7 DAYS FOR 8 DOSES. TAKE 1 TABLET BY MOUTH  ONCE A WEEK FOR 8 WEEKS. 24 capsule 1   amoxicillin -clavulanate (AUGMENTIN ) 875-125 MG tablet Take 1 tablet by mouth 2 (two) times daily. (Patient not taking: Reported on 05/31/2024) 14 tablet 0   escitalopram  (LEXAPRO ) 20 MG tablet Take 1 tablet (20 mg total) by mouth daily. (Patient not taking: Reported on 05/31/2024) 90 tablet 1   MAGNESIUM GLYCINATE PO Take 500 mg by mouth daily. (Patient not taking: Reported on 05/31/2024)     No current facility-administered medications for this visit.    Allergies as of 05/31/2024   (No Known Allergies)    Family History  Problem Relation Age of Onset   Hypertension Father    Liver disease Neg Hx    Colon cancer Neg Hx    Esophageal cancer Neg Hx     Review of Systems:    Constitutional: No weight loss, fever, chills, weakness or fatigue HEENT: Eyes: No change in vision               Ears, Nose, Throat:  No change in hearing or congestion Skin: No rash or itching Cardiovascular: No chest pain, chest pressure or palpitations   Respiratory: No SOB or cough Gastrointestinal: See HPI and otherwise negative Genitourinary: No dysuria or change in urinary frequency Neurological: No headache, dizziness or syncope Musculoskeletal: No new muscle or joint pain Hematologic: No bleeding or bruising Psychiatric: No history of depression or anxiety    Physical Exam:  Vital signs: BP  118/68   Pulse 76   Ht 5' 9 (1.753 m)   Wt 211 lb (95.7 kg)   BMI 31.16 kg/m   Constitutional:   Pleasant male appears to be in NAD, Well developed, Well nourished, alert and cooperative Eyes:   PEERL, EOMI. No icterus. Conjunctiva pink. Neck:  Supple Throat: Oral cavity and pharynx without inflammation, swelling or lesion.  Respiratory: Respirations even and unlabored. Lungs clear to auscultation bilaterally.   No wheezes, crackles, or rhonchi.  Cardiovascular: Normal S1, S2. Regular rate and rhythm. No peripheral edema, cyanosis or pallor.  Gastrointestinal:   Soft, nondistended, nontender. No rebound or guarding. Normal bowel sounds. No appreciable masses or hepatomegaly. Rectal:  Not performed.  Msk:  Symmetrical without gross deformities. Without edema, no deformity or joint abnormality.  Neurologic:  Alert and  oriented x4;  grossly normal neurologically.  Skin:   Dry and intact without significant lesions or rashes.  RELEVANT LABS AND IMAGING: CBC    Latest Ref Rng & Units 04/13/2024    9:45 AM 03/10/2023    2:05 PM 01/13/2022    3:50 PM  CBC  WBC 3.4 - 10.8 x10E3/uL 6.9  10.7  10.3   Hemoglobin 13.0 - 17.7 g/dL 84.0  84.4  84.1   Hematocrit 37.5 - 51.0 % 46.7  46.3  46.0   Platelets 150 - 450 x10E3/uL 279  316  291      CMP     Latest Ref Rng & Units 04/13/2024    9:45 AM 03/10/2023    2:05 PM 01/13/2022    3:50 PM  CMP  Glucose 70 - 99 mg/dL 94  85  96   BUN 6 - 20 mg/dL 13  13  11    Creatinine 0.76 - 1.27 mg/dL 8.91  8.85  8.86   Sodium 134 - 144 mmol/L 140  143  141   Potassium 3.5 - 5.2 mmol/L 4.3  4.2  4.6   Chloride 96 - 106 mmol/L 106  106  104   CO2 20 - 29 mmol/L 20  22  25    Calcium 8.7 - 10.2 mg/dL 9.4  9.5  9.5   Total Protein 6.0 - 8.5 g/dL 6.5  6.9  7.0   Total Bilirubin 0.0 - 1.2 mg/dL 0.3  0.5  0.7   Alkaline Phos 47 - 123 IU/L 55  48  56   AST 0 - 40 IU/L 16  18  17    ALT 0 - 44 IU/L 23  30  22       Lab Results  Component Value Date   TSH 1.170 12/17/2018  04/13/24 vitamin D  24.4  Assessment: Encounter Diagnoses  Name Primary?   Gastroesophageal reflux disease, unspecified whether esophagitis present Yes   Altered bowel habits   38 year old male patient who presents with GERD that has since resolved with dietary changes and weight loss.  Patient reports he had pretty significant reflux symptoms back in July that have improved with losing about 10 pounds as well as following a strict carnivore diet.  Patient has not required any antiacids however we did discuss using over-the-counter Pepcid or Gourmet reflux  as needed.  Patient plans to continue to lose more weight.  We discussed evaluating with upper GI endoscopy however patient would like to hold off for now since his symptoms have improved. Patient has had occasional bloating with constipation with changing his diet to high-protein and recommended the patient incorporate more fiber supplementation along with plenty of water.  Plan: -Reinforced GERD diet, no late meals -OTC pepcid prn -samples reflux gourmet -Recommend high fiber diet -start fiber supplement po daily  -RTC as needed  Thank you for the courtesy of this consult. Please call me with any questions or concerns.   Mehran Guderian, FNP-C [facility] 05/31/2024, 11:56 AM  Cc: Joyce Norleen BROCKS, MD

## 2024-05-31 NOTE — Patient Instructions (Addendum)
 GERD Recommend GERD diet , no late meals OTC pepcid (famotidine)    Altered bowel habits  Recommend high fiber diet Start Citrucel or Benefiber supplement   _______________________________________________________  If your blood pressure at your visit was 140/90 or greater, please contact your primary care physician to follow up on this.  _______________________________________________________  If you are age 36 or older, your body mass index should be between 23-30. Your Body mass index is 31.16 kg/m. If this is out of the aforementioned range listed, please consider follow up with your Primary Care Provider.  If you are age 2 or younger, your body mass index should be between 19-25. Your Body mass index is 31.16 kg/m. If this is out of the aformentioned range listed, please consider follow up with your Primary Care Provider.   ________________________________________________________  The [facility] providers would like to encourage you to use MYCHART to communicate with providers for non-urgent requests or questions.  Due to long hold times on the telephone, sending your provider a message by [facility] may be a faster and more efficient way to get a response.  Please allow 48 business hours for a response.  Please remember that this is for non-urgent requests.  _______________________________________________________  [facility] is using a team-based approach to care.  Your team is made up of your doctor and two to three APPS. Our APPS (Nurse Practitioners and Physician Assistants) work with your physician to ensure care continuity for you. They are fully qualified to address your health concerns and develop a treatment plan. They communicate directly with your gastroenterologist to care for you. Seeing the Advanced Practice Practitioners on your physician's team can help you by facilitating care more promptly, often allowing for earlier appointments, access to diagnostic  testing, procedures, and other specialty referrals.   Thank you for trusting me with your gastrointestinal care. Deanna May, FNP-C

## 2024-07-05 ENCOUNTER — Ambulatory Visit (INDEPENDENT_AMBULATORY_CARE_PROVIDER_SITE_OTHER): Admitting: Behavioral Health

## 2024-07-05 ENCOUNTER — Encounter: Payer: Self-pay | Admitting: Behavioral Health

## 2024-07-05 DIAGNOSIS — F3342 Major depressive disorder, recurrent, in full remission: Secondary | ICD-10-CM

## 2024-07-05 DIAGNOSIS — F411 Generalized anxiety disorder: Secondary | ICD-10-CM

## 2024-07-05 NOTE — Progress Notes (Signed)
 "     [facility]  Patient ID: Jeremiah Bentley,  MRN: [id number]  PCP: Vita Morrow, MD  Date of Evaluation: 07/05/2024 Time spent:20 minutes  Chief Complaint:  Chief Complaint   Depression; Anxiety; Follow-up; Medication Refill; Patient Education     HISTORY/CURRENT STATUS: HPI Jeremiah Bentley, 38 year old male presents to this office for follow up and medication management.  Weaned off all medication except for occasional hydroxyzine .  Still some mild situational anxiety when prior to picking son up for visitation. Still dealing with legal battle in court for custody of son.  He says that his anxiety today is 2/10, and his depression today is 0/10.  Says that he is sleeping 7-8 hours every night.  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 In custody battle for for child currently. He denies any history of mania, no psychosis, no auditory or visual hallucinations.  No SI or HI.   Past psychotropic medication trials: Zoloft  Celexa         Individual Medical History/ Review of Systems: Changes? :No   Allergies: Patient has no known allergies.  Current Medications: Current Medications[1] Medication Side Effects: none  Family Medical/ Social History: Changes? No  MENTAL HEALTH EXAM:  There were no vitals taken for this visit.There is no height or weight on file to calculate BMI.  General Appearance: Casual and Neat  Eye Contact:  Good  Speech:  Clear and Coherent  Volume:  Normal  Mood:  Anxious and Depressed  Affect:  Depressed and Anxious  Thought Process:  Coherent  Orientation:  Full (Time, Place, and Person)  Thought Content: Logical   Suicidal Thoughts:  No  Homicidal Thoughts:  No  Memory:  WNL  Judgement:  Good  Insight:  Good  Psychomotor Activity:  Normal  Concentration:  Concentration: Good  Recall:  Good  Fund of Knowledge: Good  Language: Good  Assets:  Desire for Improvement  ADL's:  Intact   Cognition: WNL  Prognosis:  Good    DIAGNOSES: No diagnosis found.  Receiving Psychotherapy: No    RECOMMENDATIONS:   Greater than 50% of  20 min face to face time with patient was spent on counseling and coordination of care.  No social changes this.  He has weaned off all medication since September except for taking occasional hydroxyzine  for sleep.  So far doing very well.  Agrees to 1 year follow-up   We agreed to:  To continue hydroxyzine  25 mg three times daily as needed  Will contact this office for worsening symptoms or side effects Provided emergency contact information To follow-up in 1 year to reassess Reviewed PDMP    Redell DELENA Pizza, NP     [1]  Current Outpatient Medications:    amoxicillin -clavulanate (AUGMENTIN ) 875-125 MG tablet, Take 1 tablet by mouth 2 (two) times daily. (Patient not taking: Reported on 05/31/2024), Disp: 14 tablet, Rfl: 0   aspirin-acetaminophen -caffeine (EXCEDRIN MIGRAINE) 250-250-65 MG tablet, Take 2 tablets by mouth every 6 (six) hours as needed for headache., Disp: , Rfl:    escitalopram  (LEXAPRO ) 20 MG tablet, Take 1 tablet (20 mg total) by mouth daily. (Patient not taking: Reported on 05/31/2024), Disp: 90 tablet, Rfl: 1   MAGNESIUM GLYCINATE PO, Take 500 mg by mouth daily. (Patient not taking: Reported on 05/31/2024), Disp: , Rfl:    meloxicam  (MOBIC ) 15 MG tablet, Take 1 tablet (15 mg total) by mouth daily., Disp: 30 tablet, Rfl: 0   Vitamin D ,  Ergocalciferol , (DRISDOL ) 1.25 MG (50000 UNIT) CAPS capsule, TAKE 1 CAPSULE BY MOUTH EVERY 7 DAYS FOR 8 DOSES. TAKE 1 TABLET BY MOUTH ONCE A WEEK FOR 8 WEEKS., Disp: 24 capsule, Rfl: 1  "

## 2024-07-07 ENCOUNTER — Other Ambulatory Visit: Payer: Self-pay | Admitting: Medical Genetics

## 2024-07-14 ENCOUNTER — Ambulatory Visit (INDEPENDENT_AMBULATORY_CARE_PROVIDER_SITE_OTHER): Payer: Self-pay | Admitting: Family Medicine

## 2024-07-14 VITALS — BP 124/86 | HR 60 | Wt 213.4 lb

## 2024-07-14 DIAGNOSIS — R0683 Snoring: Secondary | ICD-10-CM | POA: Diagnosis not present

## 2024-07-14 DIAGNOSIS — E669 Obesity, unspecified: Secondary | ICD-10-CM | POA: Diagnosis not present

## 2024-07-14 LAB — POCT GLYCOSYLATED HEMOGLOBIN (HGB A1C): Hemoglobin A1C: 5.6 % (ref 4.0–5.6)

## 2024-07-14 NOTE — Progress Notes (Signed)
 "  Name: Jeremiah Bentley   Date of Visit: 07/14/2024   Date of last visit with me: 05/04/2024   CHIEF COMPLAINT:  Chief Complaint  Patient presents with   Follow-up    3 months follow up on blood pressure and weight.        HPI:  Discussed the use of AI scribe software for clinical note transcription with the patient, who gave verbal consent to proceed.  History of Present Illness   Jeremiah Bentley is a 39 year old male who presents with weight management and sleep concerns.  He has experienced weight fluctuations over the past few months. Initially, he lost about ten pounds after starting a carnivore diet, reducing his weight from 221 pounds in October to 214 pounds. However, he gained the weight back during the holiday season, returning to approximately 221 pounds. He has resumed the carnivore diet and feels less hungry during the day, often skipping breakfast. He is interested in weight loss medications but notes that they are not covered unless he is diagnosed with diabetes. His A1c is 5.6.  He continues to experience snoring and has not undergone a sleep study, which was initially planned in 2021 but was delayed due to COVID-19. He has tried using a mouthpiece for snoring, but it has not been effective. He is skeptical about using a CPAP machine. He has discontinued Lexapro  and is using hydroxyzine  as needed for sleep difficulties, which helps him sleep when necessary.  His cholesterol levels were high in the past, but he notes a decrease compared to the previous year. He is focusing on weight loss to further improve his cholesterol levels. He mentions a reduction in alcohol consumption and a preference for non-alcoholic eggnog during the holidays.         OBJECTIVE:       04/13/2024    8:10 AM  Depression screen PHQ 2/9  Decreased Interest 0  Down, Depressed, Hopeless 0  PHQ - 2 Score 0     BP Readings from Last 3 Encounters:  07/14/24 124/86  05/31/24 118/68   05/02/24 112/72    BP 124/86   Pulse 60   Wt 213 lb 6.4 oz (96.8 kg)   SpO2 97%   BMI 31.51 kg/m    Physical Exam          Physical Exam Constitutional:      Appearance: Normal appearance.  Neurological:     General: No focal deficit present.     Mental Status: He is alert and oriented to person, place, and time. Mental status is at baseline.     ASSESSMENT/PLAN:   Assessment & Plan Obesity (BMI 30-39.9)  Snoring    Assessment and Plan    Obesity with previously elevated blood pressure reading Recent 10-pound weight loss. A1c at 5.6 indicates good glycemic control. Discussed weight loss medications for borderline sugars, but insurance limits to diabetes cases. - Continue carnivore diet. - Monitor weight and glycemic control.  Suspected obstructive sleep apnea Snoring suggests sleep apnea. Previous sleep study incomplete. Discussed CPAP benefits if confirmed. Explained potential for weight loss medications if severe. Mentioned future oral medication possibility. - Ordered sleep study. - Consider CPAP therapy if sleep apnea is confirmed.  Hypercholesterolemia Cholesterol levels high but improving. Plan to monitor and reduce through weight loss before medication. - Continue monitoring cholesterol levels. - Encouraged weight loss to further reduce cholesterol.      Jeremiah Bentley A. Vita MD [facility] "

## 2024-10-11 ENCOUNTER — Ambulatory Visit (HOSPITAL_BASED_OUTPATIENT_CLINIC_OR_DEPARTMENT_OTHER): Admitting: Pulmonary Disease

## 2025-04-13 ENCOUNTER — Encounter: Payer: Self-pay | Admitting: Family Medicine

## 2025-07-05 ENCOUNTER — Ambulatory Visit: Admitting: Behavioral Health
